# Patient Record
Sex: Female | Born: 1985 | Hispanic: Yes | Marital: Single | Smoking: Former smoker
Health system: Southern US, Community
[De-identification: ages and names within clinical notes are randomized; demographics above are authoritative.]

## PROBLEM LIST (undated history)

## (undated) DIAGNOSIS — F32A Depression, unspecified: Secondary | ICD-10-CM

## (undated) DIAGNOSIS — F329 Major depressive disorder, single episode, unspecified: Secondary | ICD-10-CM

## (undated) HISTORY — DX: Depression, unspecified: F32.A

## (undated) HISTORY — DX: Major depressive disorder, single episode, unspecified: F32.9

---

## 2014-08-22 ENCOUNTER — Ambulatory Visit (INDEPENDENT_AMBULATORY_CARE_PROVIDER_SITE_OTHER): Payer: 59

## 2014-08-22 ENCOUNTER — Ambulatory Visit (INDEPENDENT_AMBULATORY_CARE_PROVIDER_SITE_OTHER): Payer: 59 | Admitting: Family Medicine

## 2014-08-22 VITALS — BP 110/70 | HR 129 | Temp 100.0°F | Resp 18 | Ht 64.5 in | Wt 212.0 lb

## 2014-08-22 DIAGNOSIS — J069 Acute upper respiratory infection, unspecified: Secondary | ICD-10-CM

## 2014-08-22 DIAGNOSIS — R05 Cough: Secondary | ICD-10-CM

## 2014-08-22 DIAGNOSIS — R059 Cough, unspecified: Secondary | ICD-10-CM

## 2014-08-22 DIAGNOSIS — R000 Tachycardia, unspecified: Secondary | ICD-10-CM

## 2014-08-22 LAB — POCT CBC
Granulocyte percent: 70.7 %G (ref 37–80)
HEMATOCRIT: 45.2 % (ref 37.7–47.9)
Hemoglobin: 14.8 g/dL (ref 12.2–16.2)
LYMPH, POC: 2.6 (ref 0.6–3.4)
MCH: 27.3 pg (ref 27–31.2)
MCHC: 32.7 g/dL (ref 31.8–35.4)
MCV: 83.4 fL (ref 80–97)
MID (CBC): 0.4 (ref 0–0.9)
MPV: 7.3 fL (ref 0–99.8)
PLATELET COUNT, POC: 372 10*3/uL (ref 142–424)
POC Granulocyte: 7.1 — AB (ref 2–6.9)
POC LYMPH %: 25.5 % (ref 10–50)
POC MID %: 3.8 %M (ref 0–12)
RBC: 5.42 M/uL (ref 4.04–5.48)
RDW, POC: 14.4 %
WBC: 10.1 10*3/uL (ref 4.6–10.2)

## 2014-08-22 MED ORDER — GUAIFENESIN ER 1200 MG PO TB12: 1.0000 | ORAL_TABLET | Freq: Two times a day (BID) | ORAL | Status: DC | PRN

## 2014-08-22 MED ORDER — IPRATROPIUM BROMIDE 0.03 % NA SOLN: 2.0000 | Freq: Two times a day (BID) | NASAL | Status: DC

## 2014-08-22 MED ORDER — HYDROCOD POLST-CHLORPHEN POLST 10-8 MG/5ML PO LQCR: 5.0000 mL | Freq: Two times a day (BID) | ORAL | Status: DC | PRN

## 2014-08-22 NOTE — Progress Notes (Signed)
Subjective:    Patient ID: Jennifer Clements, female    DOB: Mar 25, 1986, 28 y.o.   MRN: [id number]  Cough Associated symptoms include chills, ear pain, a fever, myalgias, postnasal drip and a sore throat. Pertinent negatives include no chest pain.  Fever  Associated symptoms include congestion, coughing, ear pain, nausea and a sore throat. Pertinent negatives include no abdominal pain, chest pain, diarrhea or vomiting.    This is a 28 year old female who presents with 6 days of URI symptoms - cough, sore throat, nasal congestion, slight ear pain.  4 days ago she started feeling tired and achy. She continued to go to work. Last night she began to feel feverish and had chills. She did not take her temperature. She noted that since last night she can tell her heart rate is more rapid than normal. She has been taking nyquil and dayquil with some relief.  She has no sick contacts. She denies facial pain, palpitations, vomiting or diarrhea. She has had some nausea.  Review of Systems  Constitutional: Positive for fever, chills and fatigue.  HENT: Positive for congestion, ear pain, postnasal drip and sore throat. Negative for ear discharge.   Eyes: Negative.   Respiratory: Positive for cough.   Cardiovascular: Negative for chest pain and palpitations (has felt rapid heart rate).  Gastrointestinal: Positive for nausea. Negative for vomiting, abdominal pain, diarrhea and constipation.  Musculoskeletal: Positive for myalgias.       Objective:   Physical Exam  Vitals reviewed. Constitutional: She is oriented to person, place, and time. She appears well-developed and well-nourished. No distress.  HENT:  Head: Normocephalic and atraumatic.  Right Ear: External ear normal.  Left Ear: External ear normal.  Bilateral tympanic membranes clear. Oropharynx slightly erythematous with 2+ tonsils. No exudates present  Eyes: Conjunctivae and EOM are normal. Right eye exhibits no discharge. Left eye exhibits no  discharge.  Neck: Neck supple.  Cardiovascular: Regular rhythm and intact distal pulses.   No murmur heard. tachycardic  Pulmonary/Chest: Breath sounds normal. No respiratory distress. She has no wheezes. She has no rales.  Lymphadenopathy:    She has no cervical adenopathy.  Neurological: She is alert and oriented to person, place, and time.  Skin: Skin is warm and dry. No rash noted.  Psychiatric: She has a normal mood and affect. Her behavior is normal. Thought content normal.    [facility] reading (PRIMARY) by  Dr. Alwyn Ren: normal chest xray with 2 small granulomas.      Assessment & Plan:  1. Cough 2. Tachycardia 3. Viral URI  Patient presents with URI symptoms however, due to worsening of symptoms and duration of 6 days as well as tachycardia today to 129, further workup was done. Initial read on her chest xray was normal. CBC was within normal limits. This is likely just a viral URI. She was prescribed tussionex, mucinex and atrovent nasal spray. She can take ibuprofren if needed for fever or discomfort. She will be kept out of work until 08/24/14. She will call if her symptoms worsen or fail to improve.  - DG Chest 2 View; Future - chlorpheniramine-HYDROcodone (TUSSIONEX PENNKINETIC ER) 10-8 MG/5ML LQCR; Take 5 mLs by mouth every 12 (twelve) hours as needed for cough (cough).  Dispense: 100 mL; Refill: 0 - POCT CBC - Guaifenesin (MUCINEX MAXIMUM STRENGTH) 1200 MG TB12; Take 1 tablet (1,200 mg total) by mouth every 12 (twelve) hours as needed.  Dispense: 14 tablet; Refill: 1 - ipratropium (ATROVENT) 0.03 % nasal  spray; Place 2 sprays into both nostrils 2 (two) times daily.  Dispense: 30 mL; Refill: 0

## 2014-08-22 NOTE — Progress Notes (Signed)
Discussed patient and reviewed x-ray and agree with treatment plan.

## 2014-08-22 NOTE — Progress Notes (Signed)
I have examined this patient along with Ms. Bush, PA-C and agree.  

## 2014-08-22 NOTE — Patient Instructions (Signed)
Drink plenty of water and get plenty of rest. Return to clinic if symptoms don't improve.  Upper Respiratory Infection, Adult An upper respiratory infection (URI) is also known as the common cold. It is often caused by a type of germ (virus). Colds are easily spread (contagious). You can pass it to others by kissing, coughing, sneezing, or drinking out of the same glass. Usually, you get better in 1 or 2 weeks.  HOME CARE   Only take medicine as told by your doctor.  Use a warm mist humidifier or breathe in steam from a hot shower.  Drink enough water and fluids to keep your pee (urine) clear or pale yellow.  Get plenty of rest.  Return to work when your temperature is back to normal or as told by your doctor. You may use a face mask and wash your hands to stop your cold from spreading. GET HELP RIGHT AWAY IF:   After the first few days, you feel you are getting worse.  You have questions about your medicine.  You have chills, shortness of breath, or brown or red spit (mucus).  You have yellow or brown snot (nasal discharge) or pain in the face, especially when you bend forward.  You have a fever, puffy (swollen) neck, pain when you swallow, or white spots in the back of your throat.  You have a bad headache, ear pain, sinus pain, or chest pain.  You have a high-pitched whistling sound when you breathe in and out (wheezing).  You have a lasting cough or cough up blood.  You have sore muscles or a stiff neck. MAKE SURE YOU:   Understand these instructions.  Will watch your condition.  Will get help right away if you are not doing well or get worse. Document Released: 04/20/2008 Document Revised: 01/25/2012 Document Reviewed: 02/07/2014 [facility] Patient Information 2015 [employer] [place]. This information is not intended to replace advice given to you by your health care provider. Make sure you discuss any questions you have with your health care provider.

## 2014-08-31 ENCOUNTER — Other Ambulatory Visit: Payer: Self-pay | Admitting: Physician Assistant

## 2014-08-31 ENCOUNTER — Other Ambulatory Visit (HOSPITAL_COMMUNITY)
Admission: RE | Admit: 2014-08-31 | Discharge: 2014-08-31 | Disposition: A | Discharge status: Home / Self Care | Payer: 59 | Source: Ambulatory Visit | Attending: Physician Assistant | Admitting: Physician Assistant

## 2014-08-31 DIAGNOSIS — Z124 Encounter for screening for malignant neoplasm of cervix: Secondary | ICD-10-CM | POA: Insufficient documentation

## 2014-08-31 DIAGNOSIS — Z1151 Encounter for screening for human papillomavirus (HPV): Secondary | ICD-10-CM | POA: Insufficient documentation

## 2014-09-03 LAB — CYTOLOGY - PAP

## 2015-03-27 IMAGING — CR DG CHEST 2V
2 series · 2 of 2 positions shown · non-contrast
Comparison: None.

CLINICAL DATA: Initial encounter for Cough and congestion times 40
used.

EXAM:
CHEST  2 VIEW

[PA]
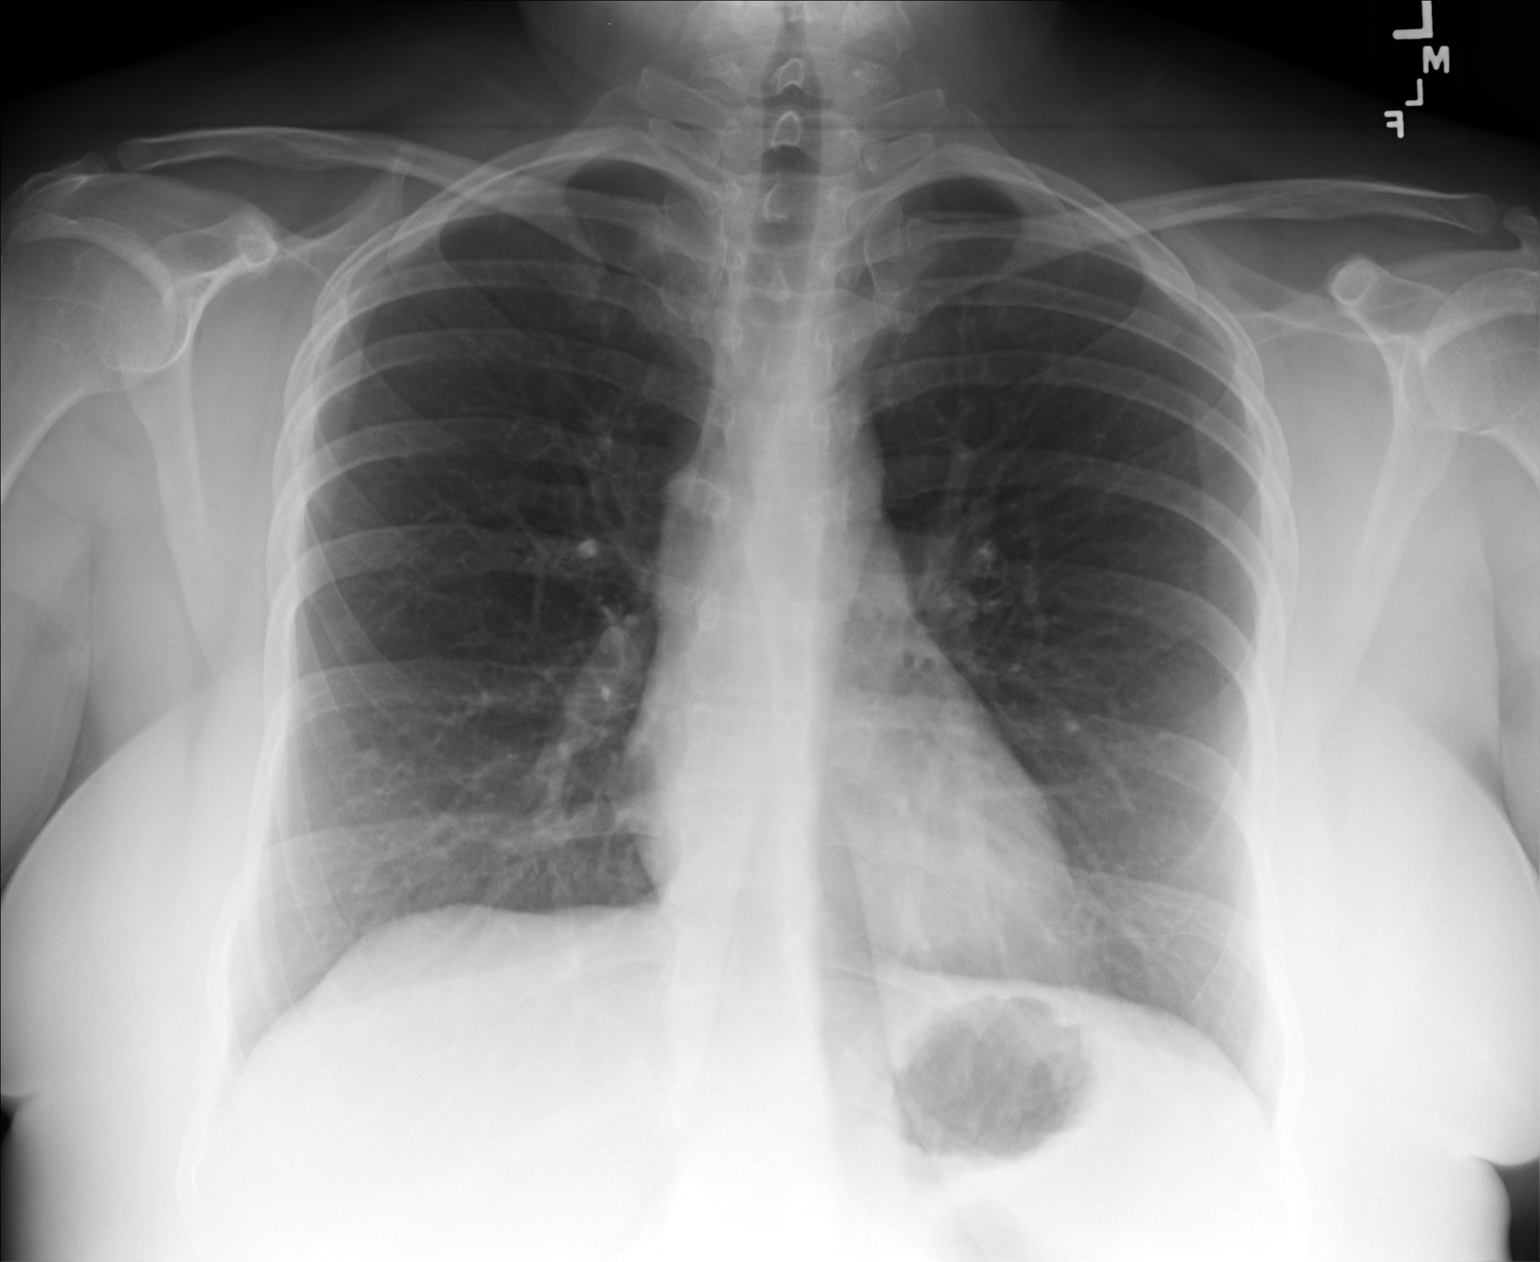

[lateral]
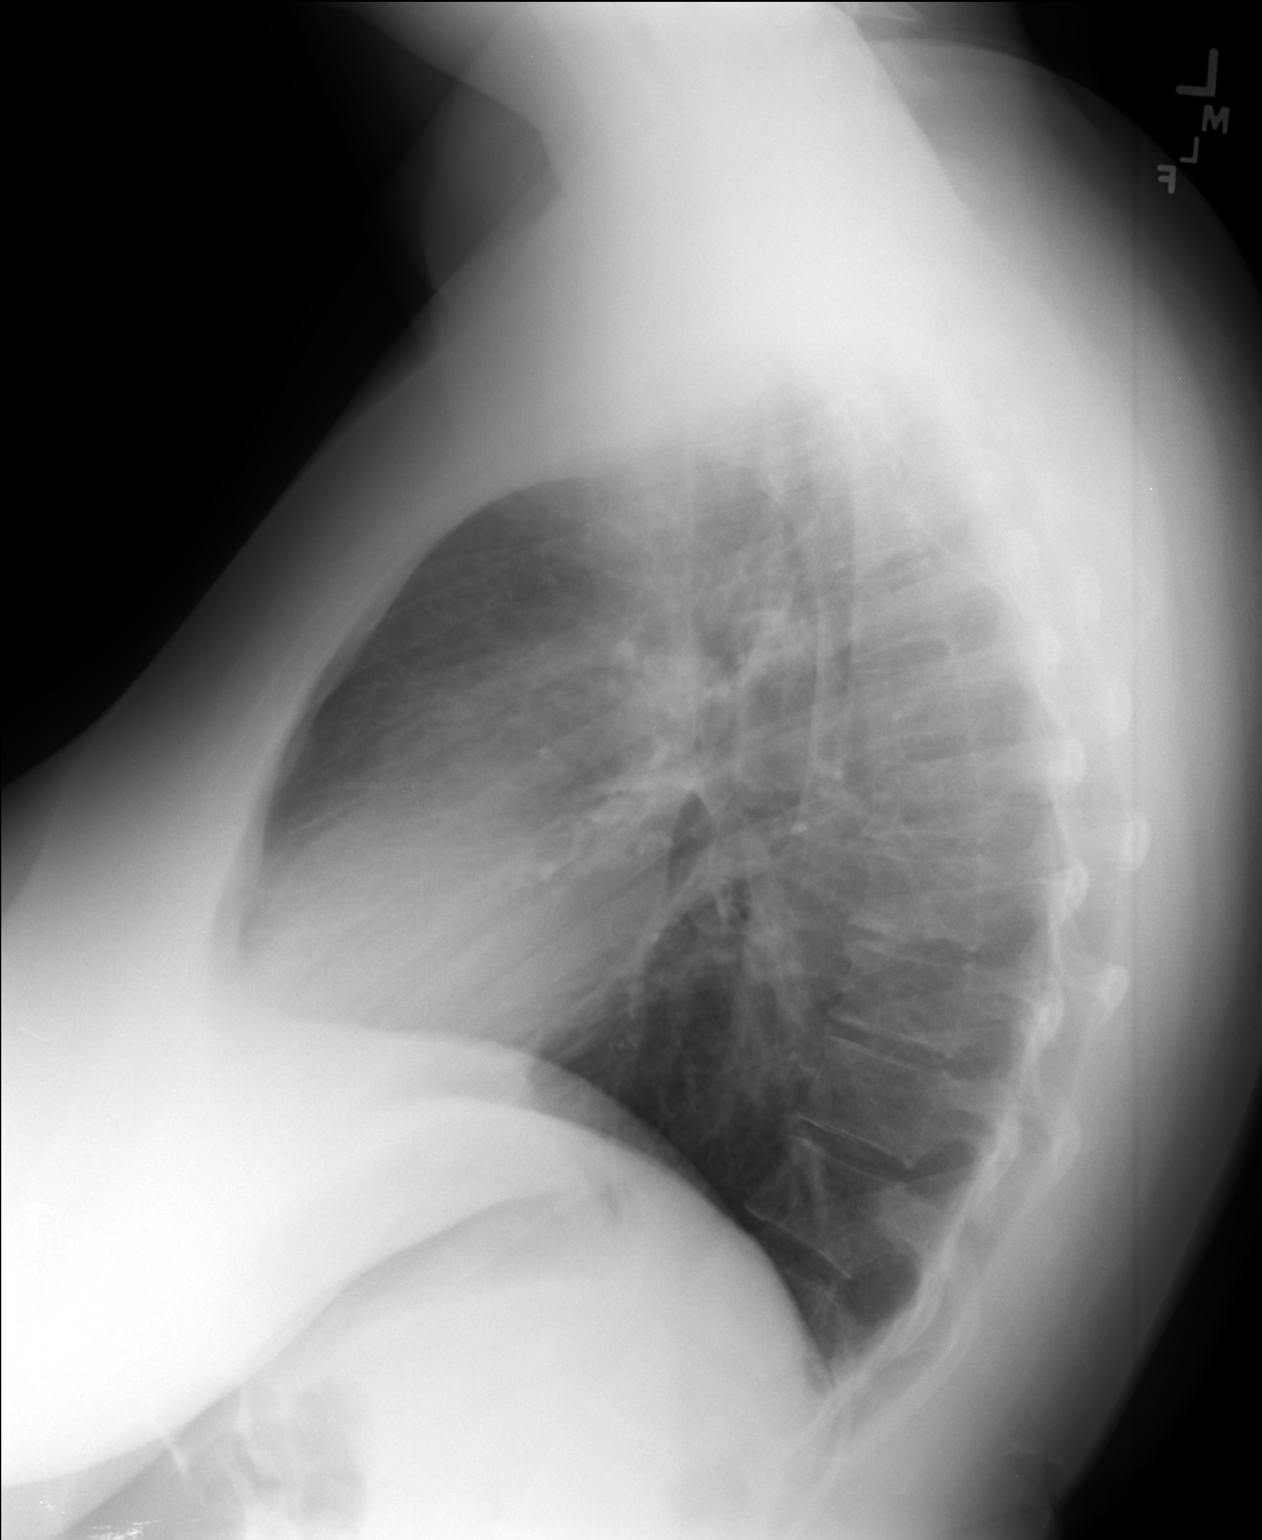

[2 of 2 positions shown; findings below may reference images not displayed]

FINDINGS: 4 mm nodular density is seen in the right lung. Left lung is clear.
No edema or focal airspace consolidation. No pleural effusion or
pneumothorax. The cardiopericardial silhouette is within normal
limits for size. Imaged bony structures of the thorax are intact.
IMPRESSION: Small nodular density projects over the anterior right sixth rib.
This may well be a granuloma and while calcification of this nodule
cannot be confirmed by x-ray, primary bronchogenic neoplasm would be
unlikely in a patient of this age. Otherwise no acute
cardiopulmonary findings.

## 2019-05-29 ENCOUNTER — Encounter (INDEPENDENT_AMBULATORY_CARE_PROVIDER_SITE_OTHER): Payer: Self-pay

## 2019-05-29 ENCOUNTER — Ambulatory Visit (INDEPENDENT_AMBULATORY_CARE_PROVIDER_SITE_OTHER): Payer: BC Managed Care – PPO | Admitting: Psychiatry

## 2019-05-29 ENCOUNTER — Other Ambulatory Visit: Payer: Self-pay

## 2019-05-29 ENCOUNTER — Encounter: Payer: Self-pay | Admitting: Psychiatry

## 2019-05-29 DIAGNOSIS — F331 Major depressive disorder, recurrent, moderate: Secondary | ICD-10-CM

## 2019-05-29 DIAGNOSIS — F411 Generalized anxiety disorder: Secondary | ICD-10-CM

## 2019-05-29 NOTE — Progress Notes (Signed)
[facility] Counselor Initial Adult Exam  Name: Jennifer Clements Date: 05/29/2019 MRN: [id number] DOB: 06/07/1986 PCP: Patient, No Pcp Per  Time spent: 51 minutes   Guardian/Payee:  patient    Paperwork requested:  Yes   Reason for Visit /Presenting Problem: Patient reported that she has been out of therapy for 6 years.  She shared that she was treated in the  past for anxiety and depression.  Patient reported that the depression was the primary issue.  She also shared that she has been diagnosed with ADHD. Patient was on meds for a while.  She reported that she was on Prozac for a while and it has helped but is having issues continuing  it due to changes in PCP.  Patient reported that she is having crying spells and feels she is having less control of herself recently. Saw someone in her 65's due to mother wanting her to go since her sister was going through a lot at a time.  Patient was diagnosed at 44 and 21 for ADHD.   Patient was born in [place] moved to [place], [place], back to [place] and back to [place].  Dad was in the [employer].  Close to parents they divorced 10 years ago. They both live in states currently.  Dad remarried when patient was an adult. Most of my maternal family is still in [place] feel like I am in mourning for the whole country due to political situation. Worried about her grandparents with the situation and poor health.  Work stress, issues with her supervisor and overwhelming amount of work. Patient was at [facility] in her 44's and was on Adderall.  She is currently wondering if she needs to be back on it due to difficulty completing tasks at work.  Patient was encouraged to consider what goals she wanted to have for treatment to be discussed at next session.  She was also encouraged to see 1 of the providers at the practice for medication assessment. Mental Status Exam:   Appearance:   Well Groomed     Behavior:  Appropriate  Motor:  Restlestness  Speech/Language:    Normal Rate  Affect:  Appropriate  Mood:  anxious and sad  Thought process:  normal  Thought content:    WNL  Sensory/Perceptual disturbances:    WNL  Orientation:  oriented to person, place, time/date and situation  Attention:  Fair  Concentration:  Fair  Memory:  Immediate;   Zinc of knowledge:   Good  Insight:    Good  Judgment:   Good  Impulse Control:  Good   Reported Symptoms:  Crying spells, feeling like things are harder, difficulty completing tasks and staying focused,  Anxiety, heart rate gets high, anxiety attacks, sleep issues, difficulty stopping thoughts and relaxing to go to sleep, unsettling dreams, muscle tension, headaches, neck pain, clenching hands more, isolating, difficulty sticking to a list  Risk Assessment: Danger to Self:  Yes.  without intent/plan patient reported that she just wants pain to stop doesn't want to hurt herself. Self-injurious Behavior: No Danger to Others: No Duty to Warn:no Physical Aggression / Violence:No  Access to Firearms a concern: No  Gang Involvement:No  Patient / guardian was educated about steps to take if suicide or homicide risk level increases between visits: yes While future psychiatric events cannot be accurately predicted, the patient does not currently require acute inpatient psychiatric care and does not currently meet [facility] involuntary commitment criteria.  Substance Abuse History: Current substance abuse:  No     Past Psychiatric History:   Previous psychological history is significant for anxiety and depression Outpatient Providers:none current History of Psych Hospitalization: No  Psychological Testing: none   Abuse History: Victim of No., none   Report needed: No. Victim of Neglect:No. Perpetrator of none  Witness / Exposure to Domestic Violence: No   Protective Services Involvement: No  Witness to [employer] Violence:  No   Family History:  Family History  Problem Relation Age of Onset  .  Hashimoto's thyroiditis Mother   . Depression Mother   . Mental illness Sister   . Hypertension Maternal Grandmother   . Heart disease Maternal Grandfather   . 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 History of legal issue / charges: none  Medical History/Surgical History:reviewed Past Medical History:  Diagnosis Date  . Depression     History reviewed. No pertinent surgical history.  Medications: Current Outpatient Medications  Medication Sig Dispense Refill  . chlorpheniramine-HYDROcodone (TUSSIONEX PENNKINETIC ER) 10-8 MG/5ML LQCR Take 5 mLs by mouth every 12 (twelve) hours as needed for cough (cough). 100 mL 0  . Guaifenesin (MUCINEX MAXIMUM STRENGTH) 1200 MG TB12 Take 1 tablet (1,200 mg total) by mouth every 12 (twelve) hours as needed. 14 tablet 1  . ipratropium (ATROVENT) 0.03 % nasal spray Place 2 sprays into both nostrils 2 (two) times daily. 30 mL 0   No current facility-administered medications for this visit.    Birth control  No Known Allergies  Diagnoses:    ICD-10-CM   1.  Major depressive disorder, recurrent episode, moderate (HCC)  F33.1   2. Generalized anxiety disorder  F41.1     Plan of Care: 1.  Patient to continue to engage in individual counseling 2-4 times a month or as needed. 2.  Patient to identify goals for treatment to discuss at next session to decrease depression and anxiety symptoms. 3.  Patient to contact this office, go to the local ED or call 911 if a crisis or emergency develops between visits.   Stevphen MeuseHolly Pinkie Manger, [facility]   This record has been created using [employer] software.  Chart creation errors have been sought, but may not always have been located and corrected. Such creation errors do not reflect on the standard of medical care.

## 2019-06-26 ENCOUNTER — Ambulatory Visit (INDEPENDENT_AMBULATORY_CARE_PROVIDER_SITE_OTHER): Payer: BC Managed Care – PPO | Admitting: Psychiatry

## 2019-06-26 ENCOUNTER — Other Ambulatory Visit: Payer: Self-pay

## 2019-06-26 DIAGNOSIS — F331 Major depressive disorder, recurrent, moderate: Secondary | ICD-10-CM

## 2019-06-26 DIAGNOSIS — F411 Generalized anxiety disorder: Secondary | ICD-10-CM

## 2019-06-26 NOTE — Progress Notes (Signed)
[facility] Counselor/Therapist Progress Note  Patient ID: Jennifer Clements, MRN: [id number],    Date: 06/26/2019  Time Spent: 50 minutes start time 5:03 PM end time 5:53 PM  Treatment Type: Individual Therapy  Reported Symptoms: sadness, anxiety, focusing issues, low motivation, fatigue  Mental Status Exam:  Appearance:   Well Groomed     Behavior:  Appropriate  Motor:  Normal  Speech/Language:   Normal Rate  Affect:  Appropriate  Mood:  anxious  Thought process:  normal  Thought content:    WNL  Sensory/Perceptual disturbances:    WNL  Orientation:  oriented to person, place, time/date and situation  Attention:  Good  Concentration:  Good  Memory:  WNL  Fund of knowledge:   Good  Insight:    Good  Judgment:   Good  Impulse Control:  Good   Risk Assessment: Danger to Self:  No Self-injurious Behavior: No Danger to Others: No Duty to Warn:no Physical Aggression / Violence:No  Access to Firearms a concern: No  Gang Involvement:No   Subjective: Patient was present for session.  Patient reported that her anxiety was getting very overwhelming for her.  She explained that she is realizing that it is time to talk to a provider about medication even though that is very hard for her to acknowledge.  Patient shared different reasons she is been struggling with her anxiety including her roommate having difficulty social distancing.  Patient shared she is also very worried about her father because he has difficulty making positive decisions when it comes to making sure he is wearing his mask and social distancing.  Patient reported that her biggest issue is her supervisor.  She shared she has struggles with the fact that he seems to let things go that are very important and then clients have to be put off and things that need to be done for them are not completed.  Patient went on to explain that he also interrupts her throughout her day and she gets distracted and is not able to  complete any of her tasks then she gets more more overwhelmed and has difficulty focusing.  Patient was able to think through the fact that she can put her phone on silence when she needs to and have the front office take because of client's so that she can get focused and get what she needs to accomplished.  Also had patient do an E MDR container exercise to try and give her a visual tool to utilize when dealing with her supervisor.  Patient was able to develop a picture that she felt would help her to disconnect when he is making choices that are very frustrating for her.  Patient was encouraged to remind herself to focus on the things she can control fix and change and to let go of the other things.  Interventions: Cognitive Behavioral Therapy, Solution-Oriented/Positive Psychology and Eye Movement Desensitization and Reprocessing (EMDR)  Diagnosis:   ICD-10-CM   1. Major depressive disorder, recurrent episode, moderate (HCC)  F33.1   2. Generalized anxiety disorder  F41.1     Plan: 1.  Patient to continue to engage in individual counseling 2-4 times a month or as needed. 2.  Patient to identify and apply CBT, coping skills learned in session to decrease depression and anxiety symptoms. 3.  Patient to contact this office, go to the local ED or call 911 if a crisis or emergency develops between visits.  [name], [facility]  This record has  been created using [employer] software.  Chart creation errors have been sought, but may not always have been located and corrected. Such creation errors do not reflect on the standard of medical care.

## 2019-06-28 ENCOUNTER — Encounter: Payer: Self-pay | Admitting: Psychiatry

## 2019-07-03 ENCOUNTER — Encounter: Payer: Self-pay | Admitting: Psychiatry

## 2019-07-03 ENCOUNTER — Ambulatory Visit (INDEPENDENT_AMBULATORY_CARE_PROVIDER_SITE_OTHER): Payer: BC Managed Care – PPO | Admitting: Psychiatry

## 2019-07-03 ENCOUNTER — Other Ambulatory Visit: Payer: Self-pay

## 2019-07-03 VITALS — BP 131/84 | HR 91 | Ht 64.0 in | Wt 220.0 lb

## 2019-07-03 DIAGNOSIS — F331 Major depressive disorder, recurrent, moderate: Secondary | ICD-10-CM

## 2019-07-03 DIAGNOSIS — F411 Generalized anxiety disorder: Secondary | ICD-10-CM | POA: Diagnosis not present

## 2019-07-03 DIAGNOSIS — G4700 Insomnia, unspecified: Secondary | ICD-10-CM

## 2019-07-03 MED ORDER — LITHIUM CARBONATE 150 MG PO CAPS: ORAL_CAPSULE | ORAL | 1 refills | Status: DC

## 2019-07-03 MED ORDER — FLUOXETINE HCL 10 MG PO CAPS: 10.0000 mg | ORAL_CAPSULE | Freq: Every day | ORAL | 1 refills | Status: DC

## 2019-07-03 NOTE — Progress Notes (Signed)
[facility] MD/PA/NP Initial Note  07/03/2019 4:00 PM Jennifer Clements  MRN:  [id number]  Chief Complaint:  Chief Complaint    Depression; Anxiety; ADD; Insomnia      HPI: Patient is a 33 year old female being seen for initial evaluation for treatment of depression, anxiety, attention deficit, and insomnia.  She is referred by her therapist, [name], [place].  Patient reports that she noticed some depression starting around age 48. Reports that she has taken some meds in the past to include Prozac and Zoloft with some improvement in anxiety and depression.Reports that depression has not fully remitted, and there are periods where it has been better and worse. Recalls having some mild anxiety in childhood. Notices jaw tightness with anxiety and muscle tension with anxiety. Reports that she is more likely to over-react at times with anxiety. Reports that she has had some panic attacks in the past in the context of severe anxiety. Denies any recent panic attacks. Reports worry about family and work. Reports that she has some catastrophic thinking and occ rumination. Denies social anxiety. Denies any excessive checking behaviors.    Describes mood as "fragile" and easily moved to tears. Reports feeling "bleak" most of the time. Experiences some irritability. She reports that she has noticed more depression since the end of last year. Difficulty falling asleep and awakens several times during the night. Reports that she rarely feels rested upon awakening. Nightmares about once a week. Reports that she may be in bed for 7-8 hours however sleep is fragmented. Appetite has been normal. Reports energy and motivation have been lower and interfering with function.She reports that concentration has been worse and is easily distracted. Reports that at times she will lose her train of thought with typing. Reports that sometimes will start to stare off. Enjoys spending time with sister and roommate. Notices that she has  withdrawn some from others. Reports passive death wishes and vague suicidal thoughts at times without plan or intent. Denies h/o past suicide attempts.   Denies any h/o manic s/s. Denies paranoia. Denies AH or VH.   Reports that she has been dx'd with ADD in second grade and was prescribed Ritalin and took only briefly. Later dx'd again with Adderall IR and took it for about 1.5 years  Born [place]. Family moved frequently. Sister is 100 months younger. Lived in [place] from ages [age] yo. Reports that she and her mother have a good relationship. Ok relationship with father. Parents divorced when she was 82 yo and they lived with maternal grandparents for 5 years. Graduated HS and has some college. 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 Reports that she likes her job. Has been working on site and seeing clients remotely. Works Tuesday through Saturday, 8:30-5:30 pm. Has had same roommate for years and they have been friends. Not in a relationship. Mother and sister are primary supports. Sister lives in [place] and Mom lives in [place]. Enjoys knitting, reading, and would like to start sewing. Reports that sister has attempted suicide several times and pt was present with sister's first attempt. Denies any other traumatic events.    Past Psychiatric Medication Trials: Prozac- Effective for anxiety. Took low dose, possibly 10 mg. Does not recall side effects.  Zoloft- Effective and then no longer seemed to be as effective. Had discontinuation s/s. Stopped in 2015   Adderall IR- Effective. Does not recall side effects. Typically felt more calm. May have taken 10 mg 1/2-1 tab  BID Trazodone Xanax- helpful for insomnia.   Visit Diagnosis:    ICD-10-CM   1. Major depressive disorder, recurrent episode, moderate (HCC)  F33.1 FLUoxetine (PROZAC) 10 MG capsule    lithium carbonate 150 MG capsule  2. Generalized anxiety disorder  F41.1 FLUoxetine (PROZAC) 10  MG capsule  3. Insomnia, unspecified type  G47.00 lithium carbonate 150 MG capsule    Past Psychiatric History: Was previously seen in Dr. Rolly PancakeParish McKinney's office for medication management and therapy. Recently started psychotherapy with Stevphen MeuseHolly Ingram, LPC. Denies past psychiatric hospitalization.  Past Medical History:  Past Medical History:  Diagnosis Date  . Depression    No past surgical history on file.   Family History:  Family History  Problem Relation Age of Onset  . Hashimoto's thyroiditis Mother   . Depression Mother   . Mental illness Sister   . Mood Disorder Sister   . Anxiety disorder Sister   . Hypertension Maternal Grandmother   . Heart disease Maternal Grandfather   . Alcohol abuse Paternal Grandfather   . Mental illness Maternal Aunt   . Alcohol abuse Paternal Aunt   . Depression Paternal Aunt   . Personality disorder Cousin     Social History:  Social History   Socioeconomic History  . Marital status: Single    Spouse name: Not on file  . Number of children: Not on file  . Years of education: Not on file  . Highest education level: Not on file  Occupational History  . Not on file  Social Needs  . Financial resource strain: Not on file  . Food insecurity    Worry: Not on file    Inability: Not on file  . Transportation needs    Medical: Not on file    Non-medical: Not on file  Tobacco Use  . Smoking status: Former Games developermoker  . Smokeless tobacco: Never Used  Substance and Sexual Activity  . Alcohol use: Yes    Alcohol/week: 2.0 standard drinks    Types: 2 Glasses of wine per week  . Drug use: Never  . Sexual activity: Not on file  Lifestyle  . Physical activity    Days per week: Not on file    Minutes per session: Not on file  . Stress: Not on file  Relationships  . Social Musicianconnections    Talks on phone: Not on file    Gets together: Not on file    Attends religious service: Not on file    Active member of club or organization: Not on  file    Attends meetings of clubs or organizations: Not on file    Relationship status: Not on file  Other Topics Concern  . Not on file  Social History Narrative  . Not on file    Allergies: No Known Allergies  Metabolic Disorder Labs: No results found for: HGBA1C, MPG No results found for: PROLACTIN No results found for: CHOL, TRIG, HDL, CHOLHDL, VLDL, LDLCALC No results found for: TSH  Therapeutic Level Labs: No results found for: LITHIUM No results found for: VALPROATE No components found for:  CBMZ  Current Medications: Current Outpatient Medications  Medication Sig Dispense Refill  . FLUoxetine (PROZAC) 10 MG capsule Take 1 capsule (10 mg total) by mouth daily. 30 capsule 1  . lithium carbonate 150 MG capsule Take 1 capsule po QHS x 3-5 days, then increase to 2 capsules po QHS 60 capsule 1  . norethindrone-ethinyl estradiol (LOESTRIN) 1-20 MG-MCG tablet TK 1 T PO  QD     No current facility-administered medications for this visit.     Medication Side Effects: none  Orders placed this visit:  No orders of the defined types were placed in this encounter.   Psychiatric Specialty Exam:  Review of Systems  HENT: Positive for sinus pain.   Psychiatric/Behavioral:       Please refer to HPI  All other systems reviewed and are negative.   Blood pressure 131/84, pulse 91, height 5\' 4"  (1.626 m), weight 220 lb (99.8 kg).Body mass index is 37.76 kg/m.  General Appearance: Casual  Eye Contact:  Good  Speech:  Clear and Coherent and Normal Rate  Volume:  Normal  Mood:  Anxious and Depressed  Affect:  Depressed and Anxious  Thought Process:  Coherent  Orientation:  Full (Time, Place, and Person)  Thought Content: Logical   Suicidal Thoughts:  Yes.  without intent/plan  Homicidal Thoughts:  No  Memory:  WNL  Judgement:  Good  Insight:  Good  Psychomotor Activity:  Normal  Concentration:  Concentration: Good  Recall:  Good  Fund of Knowledge: Good  Language: Good   Assets:  Communication Skills Desire for Improvement Resilience Social Support  ADL's:  Intact  Cognition: WNL  Prognosis:  Good   Receiving Psychotherapy: Yes   Treatment Plan/Recommendations: Patient seen for 60 minutes and greater than 50% of visit spent counseling patient regarding anxiety and depressive signs and symptoms and possible treatment options.  Discussed considering resuming Prozac since patient reports that this was well-tolerated and seemed to be beneficial.  Discussed potential benefits, risks, and side effects of Prozac.  Patient reports that she would prefer to start lower dose.  We will therefore start Prozac 10 mg daily.  Discussed potential benefits, risks, and side effects of lithium for suicidality since patient reports that she has been having passive death wishes and vague suicidal thoughts without plan or intent and this is distressing to her.  Discussed that some studies have shown that lithium can help decrease suicidal thoughts and behaviors.  Discussed that lithium can cause some drowsiness and this may be helpful for her insomnia. Patient agrees to trial of lithium.  Will start lithium 150 mg at bedtime for 3 to 5 days, then increase to 300 mg p.o. nightly.  Discussed recommendation to treat anxiety signs and symptoms since patient is more likely to tolerate stimulant when anxiety is controlled.  Also discussed that concentration difficulties may be currently exacerbated by anxiety and depression.  Patient agrees with this plan.  Recommend continuing to see [facility], [facility] for [facility].  Patient to follow-up with this provider in 4 weeks or sooner if clinically indicated.    Corie ChiquitoJessica Audreyanna Butkiewicz, PMHNP

## 2019-07-10 ENCOUNTER — Ambulatory Visit (INDEPENDENT_AMBULATORY_CARE_PROVIDER_SITE_OTHER): Payer: BC Managed Care – PPO | Admitting: Psychiatry

## 2019-07-10 ENCOUNTER — Other Ambulatory Visit: Payer: Self-pay

## 2019-07-10 DIAGNOSIS — F331 Major depressive disorder, recurrent, moderate: Secondary | ICD-10-CM

## 2019-07-10 NOTE — Progress Notes (Signed)
[facility] Counselor/Therapist Progress Note  Patient ID: Jennifer Clements, MRN: [id number],    Date: 07/10/2019  Time Spent: 50 minutes start time 5:03 p.m. end time 5:53 PM  Treatment Type: Individual Therapy  Reported Symptoms: sadness, anxiety, sleep issues,  Mental Status Exam:  Appearance:   Well Groomed     Behavior:  Appropriate  Motor:  Normal  Speech/Language:   Normal Rate  Affect:  Appropriate  Mood:  normal  Thought process:  normal  Thought content:    Rumination  Sensory/Perceptual disturbances:    WNL  Orientation:  oriented to person, place, time/date and situation  Attention:  Good  Concentration:  Good  Memory:  WNL  Fund of knowledge:   Good  Insight:    Good  Judgment:   Good  Impulse Control:  Good   Risk Assessment: Danger to Self:  No Self-injurious Behavior: No Danger to Others: No Duty to Warn:no Physical Aggression / Violence:No  Access to Firearms a concern: No  Gang Involvement:No   Subjective: Patient was present for session.  Patient reported that she feels her mood is starting to improve.  She shared that she had tried some of the strategies from last session and found them to be somewhat helpful.  She shared that work is still very difficult but she is trying to maintain perspective and remind herself to focus on the things that she can control fix and change and to allow her boss to fill responsibility for his choices.  Patient shared that she had a birthday over the weekend and it was challenging for her because people wanted to make a big deal out of her birthday but she did not really know what to do with the situation.  She shared that things are getting better with her roommate not bringing people over that have not been social distancing which is helping her.  But she knows that it still difficult for her roommate.  Also her roommate's birthday is coming up and that is bringing up a lot of stress for patient because she feels  pressured to do something for her with others but does not feel comfortable being around other people yet.  Different ways to deal with that situation were discussed with patient and the importance of her finding a balance of doing things for her roommate but also feeling comfortable was addressed.  Patient was able to develop some plans that she felt would be helpful and that she could work on with her roommate.  Explained that a lot of these issues would not concerns until COVID.  And she is hopeful that they can get to the other side of them.  Discussed the importance of figuring out ways to release some of the anxiety from her body in a healthy manner.  Patient explained she is still finding it difficult to get herself motivated to do any exercise even though she knows that helps her tremendously.  Encourage patient to break things down on the picture of how much exercise she is going to do until feels comfortable.  Patient was finally able to agree to work on trying to walk for 10 to 15 minutes in the evenings.  Patient was shared that she wanted to start the plan immediately and figured out how to talk to her roommate about taking a walk before dinner rather than them fixing dinner immediately when she gets home.  Patient reported that she feels that she gets in that habit every  night that she will be able to exercise and just released some stress from her body in a healthy manner.  Interventions: Cognitive Behavioral Therapy and Solution-Oriented/Positive Psychology  Diagnosis:   ICD-10-CM   1. Major depressive disorder, recurrent episode, moderate (HCC)  F33.1     Plan: 1.  Patient to continue to engage in individual counseling 2-4 times a month or as needed. 2.  Patient is to work on using CBT skills to help her get in the habit of exercising on a daily basis even if it is just for a short period of time.  Patient is also to follow through with plans on session to handle different situations at  work and at home that are creating anxiety and impacting her mood negatively. 3.  Patient to contact this office, go to the local ED or call 911 if a crisis or emergency develops between visits. Long-term goal: Develop healthy cognitive patterns and beliefs about self in the world that lead to the alleviation and help prevent the relapse of depression symptoms Short-term goal: Implement a regular exercise regimen as a depression reduction technique Identify and replace depressive thinking that leads to depressive feelings and actions Stevphen MeuseHolly Kashonda Sarkisyan, [facility]

## 2019-07-13 ENCOUNTER — Encounter: Payer: Self-pay | Admitting: Psychiatry

## 2019-07-31 ENCOUNTER — Encounter: Payer: Self-pay | Admitting: Psychiatry

## 2019-07-31 ENCOUNTER — Ambulatory Visit (INDEPENDENT_AMBULATORY_CARE_PROVIDER_SITE_OTHER): Payer: BC Managed Care – PPO | Admitting: Psychiatry

## 2019-07-31 ENCOUNTER — Other Ambulatory Visit: Payer: Self-pay

## 2019-07-31 VITALS — BP 141/86 | HR 80

## 2019-07-31 DIAGNOSIS — F411 Generalized anxiety disorder: Secondary | ICD-10-CM | POA: Diagnosis not present

## 2019-07-31 DIAGNOSIS — F331 Major depressive disorder, recurrent, moderate: Secondary | ICD-10-CM

## 2019-07-31 DIAGNOSIS — F900 Attention-deficit hyperactivity disorder, predominantly inattentive type: Secondary | ICD-10-CM

## 2019-07-31 MED ORDER — FLUOXETINE HCL 20 MG PO CAPS: 20.0000 mg | ORAL_CAPSULE | Freq: Every day | ORAL | 1 refills | Status: DC

## 2019-07-31 MED ORDER — AMPHETAMINE-DEXTROAMPHETAMINE 10 MG PO TABS: ORAL_TABLET | ORAL | 0 refills | Status: DC

## 2019-07-31 MED ORDER — LITHIUM CARBONATE 300 MG PO CAPS: 300.0000 mg | ORAL_CAPSULE | Freq: Every day | ORAL | 1 refills | Status: DC

## 2019-07-31 NOTE — Progress Notes (Signed)
[facility] Counselor/Therapist Progress Note  Patient ID: Jennifer Clements, MRN: [id number],    Date: 07/31/2019  Time Spent: 52 minutes start time 5 PM end time 5:52 PM Virtual Visit via Telephone Note Connected with patient by a video enabled telemedicine/telehealth application , with their informed consent, and verified patient privacy and that I am speaking with the correct person using two identifiers. I discussed the limitations, risks, security and privacy concerns of performing psychotherapy and management service by telephone and the availability of in person appointments. I also discussed with the patient that there may be a patient responsible charge related to this service. The patient expressed understanding and agreed to proceed. I discussed the treatment planning with the patient. The patient was provided an opportunity to ask questions and all were answered. The patient agreed with the plan and demonstrated an understanding of the instructions. The patient was advised to call  our office if  symptoms worsen or feel they are in a crisis state and need immediate contact.   Therapist Location: office Patient Location: home     Treatment Type: Individual Therapy  Reported Symptoms: depression, anxiety  Mental Status Exam:  Appearance:   Well Groomed     Behavior:  Appropriate  Motor:  Normal  Speech/Language:   Normal Rate  Affect:  Appropriate  Mood:  anxious  Thought process:  normal  Thought content:    WNL  Sensory/Perceptual disturbances:    WNL  Orientation:  oriented to person, place, time/date and situation  Attention:  Good  Concentration:  Good  Memory:  WNL  Fund of knowledge:   Good  Insight:    Good  Judgment:   Good  Impulse Control:  Good   Risk Assessment: Danger to Self:  No Self-injurious Behavior: No Danger to Others: No Duty to Warn:no Physical Aggression / Violence:No  Access to Firearms a concern: No  Gang Involvement:No    Subjective: Met with patient via virtual session through WebEx.  Patient reported she was making positive progress.  She had no passive thoughts of not wanting to be here and feels like she has been functioning better overall.  Patient shared that the exercises from last session has been very helpful and she utilizes it often.  Patient went on to admit she had started a week well with exercising but only made it a few days.  She did acknowledge when she was able to do it and follow the plan from session things went much better.  Had patient think through what she could do differently to work on making sure she does get into her routine of exercising each day since she noticed a positive result when she does state that.  Also discussed CBT skills to help her talk herself through getting back started after she misses the day for what ever reason.  Patient explained the biggest issue she is having currently continues to be work related.  She is finding herself getting overwhelmed and shutting down which only creates more anxiety and leads to more depression.  Patient was encouraged to think through the parts of her job that are overwhelming her and to develop strategies to manage them in smaller pieces that feel doable.  Also discussed ways that she could get more resources to help her complete work appropriately.  Patient explained that she is getting a lot of new situations and that makes her very anxious because she feels responsible for the people that she is working with.  Encouraged her to think about her role and focusing on what she can do but to also acknowledged there are things that she is not been a known or be able to complete and that that is an okay thing.  Patient was able to develop a plan to utilize her CBT skills to talk her self through accomplishing what she can at work each day.  Interventions: Cognitive Behavioral Therapy and Solution-Oriented/Positive Psychology  Diagnosis:   ICD-10-CM    1. Major depressive disorder, recurrent episode, moderate ([place])  F33.1     Plan: Patient is to continue to utilize the container exercise that she found to be very helpful from last session.  She is also to utilize CBT skills and plan developed in session to help her accomplish her goals at work to decrease feelings of being overwhelmed which leads to her depression.  Patient is also to implement the exercise plan so that she can make sure she is taking care of herself each day.  Long-term goal: Develop healthy cognitive patterns and beliefs about self in the world that lead to alleviation and and help prevent the relapse of depression symptoms. Short-term goal: Implement a regular exercise regimen as a depression reduction technique Identify and replace depressive thinking that leads to depressive feelings and actions  [name], [facility]

## 2019-07-31 NOTE — Progress Notes (Signed)
Jennifer Clements [id number] 22-Apr-1986 33 y.o.  Subjective:   Patient ID:  Jennifer Clements is a 33 y.o. (DOB 22-Apr-1986) female.  Chief Complaint:  Chief Complaint  Patient presents with  . Depression  . Anxiety  . ADHD  . Insomnia    HPI Jennifer Etiennedriana Kasa presents to the office today for follow-up of depression, anxiety, and insomnia.  "I definitely notice a difference." She reports that she has not had any episodes of crying and feeling as if she is about to lose control.  She reports that suicidal thoughts are much less frequent and better able to manage those. Continues to have some depression "but it is not as unmanageable." Reports that yesterday she was able to tell her sister how she was feeling and get support. Has occ irritability and thinks that this has been less and easier to redirect. Continues to have anxiety and reports that this has been more manageable. Reports some slight decrease in worry and rumination. Had episode of increased muscle tension on her way home from work and this led to panic s/s. She reports that she called her mother and it dissipated. Reports that she used to have this in the past.   She reports difficulty falling asleep and then difficulty getting up in the morning. She reports that it can take several hours to fall sleep and it takes less time with reading before bed. Reports awakening several times during the night. Nightmares have decreased some. Recalling other dreams more. Appetite has been normal. Energy and motivation have been variable. Reports some days it is very low and other days is able to be productive. Concentration reamins poor at times. Able to start more things since anxiety has been more manageable but then is easily distracted.   Had some nausea about 2 weeks after taking medication and started taking it with food and this resolved.   Reports that her work has recently been stressful.  Past Psychiatric Medication Trials: Prozac- Effective for  anxiety. Took low dose, possibly 10 mg. Does not recall side effects.  Zoloft- Effective and then no longer seemed to be as effective. Had discontinuation s/s. Stopped in 2015   Adderall IR- Effective. Does not recall side effects. Typically felt more calm. May have taken 10 mg 1/2-1 tab BID Trazodone Xanax- helpful for insomnia.   Review of Systems:  Review of Systems  Musculoskeletal: Negative for gait problem.       Muscle tension  Neurological: Positive for headaches. Negative for tremors.  Psychiatric/Behavioral:       Please refer to HPI    Medications: I have reviewed the patient's current medications.  Current Outpatient Medications  Medication Sig Dispense Refill  . cholecalciferol (VITAMIN D3) 25 MCG (1000 UT) tablet Take 1,000 Units by mouth daily.    Marland Kitchen. FLUoxetine (PROZAC) 20 MG capsule Take 1 capsule (20 mg total) by mouth daily. 30 capsule 1  . ibuprofen (ADVIL) 200 MG tablet Take 200 mg by mouth every 6 (six) hours as needed.    . Multiple Vitamins-Minerals (MULTIVITAMIN WOMEN PO) Take by mouth.    . norethindrone-ethinyl estradiol (LOESTRIN) 1-20 MG-MCG tablet TK 1 T PO QD    . amphetamine-dextroamphetamine (ADDERALL) 10 MG tablet Take 1/2-1 tab po BID 60 tablet 0  . lithium carbonate 300 MG capsule Take 1 capsule (300 mg total) by mouth at bedtime. 30 capsule 1   No current facility-administered medications for this visit.     Medication Side Effects: Other: Possible HA. Some nausea  with taking Lithium without food.   Allergies: No Known Allergies  Past Medical History:  Diagnosis Date  . Depression     Family History  Problem Relation Age of Onset  . Hashimoto's thyroiditis Mother   . Depression Mother   . Mental illness Sister   . Mood Disorder Sister   . Anxiety disorder Sister   . Hypertension Maternal Grandmother   . Heart disease Maternal Grandfather   . Alcohol abuse Paternal Grandfather   . Mental illness Maternal Aunt   . Alcohol abuse  Paternal Aunt   . Depression Paternal Aunt   . Personality disorder Cousin     Social History   Socioeconomic History  . Marital status: Single    Spouse name: Not on file  . Number of children: Not on file  . Years of education: Not on file  . Highest education level: Not on file  Occupational History  . Not on file  Social Needs  . Financial resource strain: Not on file  . Food insecurity    Worry: Not on file    Inability: Not on file  . Transportation needs    Medical: Not on file    Non-medical: Not on file  Tobacco Use  . Smoking status: Former Games developer  . Smokeless tobacco: Never Used  Substance and Sexual Activity  . Alcohol use: Yes    Alcohol/week: 2.0 standard drinks    Types: 2 Glasses of wine per week  . Drug use: Never  . Sexual activity: Not on file  Lifestyle  . Physical activity    Days per week: Not on file    Minutes per session: Not on file  . Stress: Not on file  Relationships  . Social Musician on phone: Not on file    Gets together: Not on file    Attends religious service: Not on file    Active member of club or organization: Not on file    Attends meetings of clubs or organizations: Not on file    Relationship status: Not on file  . Intimate partner violence    Fear of current or ex partner: Not on file    Emotionally abused: Not on file    Physically abused: Not on file    Forced sexual activity: Not on file  Other Topics Concern  . Not on file  Social History Narrative  . Not on file    Past Medical History, Surgical history, Social history, and Family history were reviewed and updated as appropriate.   Please see review of systems for further details on the patient's review from today.   Objective:   Physical Exam:  BP (!) 141/86   Pulse 80   Physical Exam Constitutional:      General: She is not in acute distress.    Appearance: She is well-developed.  Musculoskeletal:        General: No deformity.   Neurological:     Mental Status: She is alert and oriented to person, place, and time.     Coordination: Coordination normal.  Psychiatric:        Attention and Perception: Attention and perception normal. She does not perceive auditory or visual hallucinations.        Mood and Affect: Mood is anxious. Affect is not labile, blunt, angry or inappropriate.        Speech: Speech normal.        Behavior: Behavior normal. Behavior is cooperative.  Thought Content: Thought content normal. Thought content is not paranoid or delusional. Thought content does not include homicidal or suicidal ideation. Thought content does not include homicidal or suicidal plan.        Cognition and Memory: Cognition and memory normal.        Judgment: Judgment normal.     Comments: Mood presents as less depressed Insight intact. No delusions.      Lab Review:  No results found for: NA, K, CL, CO2, GLUCOSE, BUN, CREATININE, CALCIUM, PROT, ALBUMIN, AST, ALT, ALKPHOS, BILITOT, GFRNONAA, GFRAA     Component Value Date/Time   WBC 10.1 08/22/2014 1405   RBC 5.42 08/22/2014 1405   HGB 14.8 08/22/2014 1405   HCT 45.2 08/22/2014 1405   MCV 83.4 08/22/2014 1405   MCH 27.3 08/22/2014 1405   MCHC 32.7 08/22/2014 1405    No results found for: POCLITH, LITHIUM   No results found for: PHENYTOIN, PHENOBARB, VALPROATE, CBMZ   .res Assessment: Plan:   Discussed treatment options at length with patient and recommended increase in Prozac to 20 mg daily since patient has noted a partial response in mood and anxiety signs and symptoms with 10 mg dose.  Patient agrees to increase in Prozac. Patient request treatment of attention deficit since she reports that she continues to have considerable difficulty with concentration despite some partial improvement in anxiety.  Will restart Adderall 10 mg 1/2-1 tab p.o. twice daily since patient reports that this was effective and well-tolerated in the past.  Patient reports  that she plans to increase Prozac and then start Adderall about 1 week later to determine tolerability of higher dose of Prozac. We will continue lithium 300 mg at bedtime since this has been effective for her depression and chronic suicidal thoughts. Discussed option of treating insomnia, however patient prefers to hold off on starting any medication for sleep and would like to determine if insomnia is going to improve in response to other medication changes. Recommend continuing psychotherapy with [name], [place]. Patient to follow-up in 6 to 8 weeks or sooner if clinically indicated. Patient advised to contact office with any questions, adverse effects, or acute worsening in signs and symptoms.  Jennifer Clements was seen today for depression, anxiety, adhd and insomnia.  Diagnoses and all orders for this visit:  Attention deficit hyperactivity disorder (ADHD), predominantly inattentive type -     amphetamine-dextroamphetamine (ADDERALL) 10 MG tablet; Take 1/2-1 tab po BID  Major depressive disorder, recurrent episode, moderate (HCC) -     lithium carbonate 300 MG capsule; Take 1 capsule (300 mg total) by mouth at bedtime. -     FLUoxetine (PROZAC) 20 MG capsule; Take 1 capsule (20 mg total) by mouth daily.  Generalized anxiety disorder -     FLUoxetine (PROZAC) 20 MG capsule; Take 1 capsule (20 mg total) by mouth daily.     Please see After Visit Summary for patient specific instructions.  Future Appointments  Date Time Provider [place]  07/31/2019  5:00 PM [name], [facility] CP-CP None  09/11/2019  1:00 PM Thayer Headings, [facility] CP-CP None    No orders of the defined types were placed in this encounter.   -------------------------------

## 2019-08-03 ENCOUNTER — Encounter: Payer: Self-pay | Admitting: Psychiatry

## 2019-08-21 ENCOUNTER — Ambulatory Visit (INDEPENDENT_AMBULATORY_CARE_PROVIDER_SITE_OTHER): Payer: Self-pay | Admitting: Psychiatry

## 2019-08-21 ENCOUNTER — Encounter: Payer: Self-pay | Admitting: Psychiatry

## 2019-08-21 ENCOUNTER — Other Ambulatory Visit: Payer: Self-pay

## 2019-08-21 DIAGNOSIS — F331 Major depressive disorder, recurrent, moderate: Secondary | ICD-10-CM

## 2019-08-21 NOTE — Progress Notes (Signed)
      [facility] Counselor/Therapist Progress Note  Patient ID: Jennifer Clements, MRN: [id number],    Date: 08/21/2019  Time Spent: 60 minutes start time 11:05 a.m. and time 12:05 PM  Treatment Type: Individual Therapy  Reported Symptoms: anxiety, sadness, panic, fatigue, low motivation  Mental Status Exam:  Appearance:   Casual     Behavior:  Appropriate  Motor:  Normal  Speech/Language:   Normal Rate  Affect:  Appropriate  Mood:  sad  Thought process:  normal  Thought content:    WNL  Sensory/Perceptual disturbances:    WNL  Orientation:  oriented to person, place, time/date and situation  Attention:  Good  Concentration:  Good  Memory:  WNL  Fund of knowledge:   Fair  Insight:    Fair  Judgment:   Good  Impulse Control:  Good   Risk Assessment: Danger to Self:  No Self-injurious Behavior: No Danger to Others: No Duty to Warn:no Physical Aggression / Violence:No  Access to Firearms a concern: No  Gang Involvement:No   Subjective: Patient was present for session.  She reported that she felt her depression had increased and not sure why that happened. She stated she noticed it happening when she was talking to her mother.  She explained that she is in [place] and her support system has not been in the area so patient reported she worries about her.  Had patient explore more of what was triggered during conversation with mother.  She finally shared she feels a lot of guilt for not completing her degree.  Did E MDR set on that issue, suds level 8, negative cognition "I am a failure" felt guilt and shame in her chest and stomach.  Patient was able to reduce suds level to 5.  She was unable to realize she is not a failure but was able to share that she started feeling that way as a child when she could not focus and complete her work even though she knew she was mentally capable of doing so.  Also she finally admitted that her parents and teachers gave her a lot of negative  statements at that time.  Patient was encouraged to remind herself regularly she is enough and she is capable.  Discussed the importance of journaling what surfaces between sessions and getting back to exercising at least 15 minutes every day to release the emotions appropriately.  Interventions: Solution-Oriented/Positive Psychology and Eye Movement Desensitization and Reprocessing (EMDR)  Diagnosis:   ICD-10-CM   1. Major depressive disorder, recurrent episode, moderate ([place])  F33.1     Plan: Patient is to work on utilizing coping skills from previous sessions to help decrease her depression.  Patient is also to journal what surfaces between sessions that needs to still be resolved.  Patient is to work on exercising at least 15 minutes every day over the next week.  Long-term goal: Develop healthy cognitive patterns and beliefs about self in the world that lead to alleviation and help prevent the relapse of depression symptoms Short-term goal: Implement a regular exercise regimen as a depression reduction technique Identify and replace depressive thinking that leads to depressive feelings and actions  [name], [facility]

## 2019-09-11 ENCOUNTER — Ambulatory Visit (INDEPENDENT_AMBULATORY_CARE_PROVIDER_SITE_OTHER): Payer: BC Managed Care – PPO | Admitting: Psychiatry

## 2019-09-11 ENCOUNTER — Encounter: Payer: Self-pay | Admitting: Psychiatry

## 2019-09-11 ENCOUNTER — Other Ambulatory Visit: Payer: Self-pay

## 2019-09-11 VITALS — BP 128/83 | HR 83

## 2019-09-11 DIAGNOSIS — F331 Major depressive disorder, recurrent, moderate: Secondary | ICD-10-CM

## 2019-09-11 DIAGNOSIS — F411 Generalized anxiety disorder: Secondary | ICD-10-CM

## 2019-09-11 DIAGNOSIS — G4700 Insomnia, unspecified: Secondary | ICD-10-CM

## 2019-09-11 MED ORDER — LITHIUM CARBONATE 300 MG PO CAPS: 300.0000 mg | ORAL_CAPSULE | Freq: Every day | ORAL | 1 refills | Status: DC

## 2019-09-11 MED ORDER — FLUOXETINE HCL 20 MG PO CAPS: 20.0000 mg | ORAL_CAPSULE | Freq: Every day | ORAL | 1 refills | Status: DC

## 2019-09-11 MED ORDER — DOXEPIN HCL 6 MG PO TABS: 6.0000 mg | ORAL_TABLET | Freq: Every evening | ORAL | 0 refills | Status: DC | PRN

## 2019-09-11 MED ORDER — DOXEPIN HCL 3 MG PO TABS: 3.0000 mg | ORAL_TABLET | Freq: Every evening | ORAL | 0 refills | Status: DC | PRN

## 2019-09-11 NOTE — Progress Notes (Signed)
Jennifer Clements [id number] 07-19-1986 33 y.o.  Subjective:   Patient ID:  Jennifer Clements is a 33 y.o. (DOB 07-19-1986) female.  Chief Complaint:  Chief Complaint  Patient presents with  . Insomnia  . Anxiety  . Depression  . ADD    HPI Jennifer Etiennedriana Newlun presents to the office today for follow-up of ADD, Depression, and anxiety. She reports that she has recently been reluctant to take Adderall due to frequent HA's that she thinks are unrelated to medications. HA's typically start around mid-morning and can last throughout the day. Varying response to ibuprofen.  Questions if HA's may be r/t decreased sleep. Reports adequate hydration. Has noticed some neck tension, particularly when driving. Reports episodes of panic and SOB have been less frequent. She reports that she has anxiety in response to psychosocial stressors. Reports that she is better able to redirect catastrophic thoughts. Has had some depression but feels that it is "manageable." Reports that she is now able to use coping strategies. Appetite has been normal. Some difficulty falling asleep and is having frequent awakenings, sometimes up to 7 times a night. Does not recall dreams or restless legs. Energy and motivation are "variable." Reports that some days she is able to run errands and be productive and other times HA's seem to keep her from being around noise. Concentration has improved when she takes Adderall. She reports that she has had significant difficulty with concentration. Denies SI.   Some work stress.   Past Psychiatric Medication Trials: Prozac- Effective for anxiety. Took low dose, possibly 10 mg. Does not recall side effects.  Zoloft- Effective and then no longer seemed to be as effective. Had discontinuation s/s. Stopped in 2015  Adderall IR- Effective. Does not recall side effects. Typically felt more calm. May have taken 10 mg 1/2-1 tab BID Trazodone- Ineffective Xanax- helpful for insomnia.   Review of Systems:   Review of Systems  Gastrointestinal: Negative.   Musculoskeletal: Positive for neck pain. Negative for gait problem.  Skin:       Acne  Neurological: Positive for headaches. Negative for tremors.  Psychiatric/Behavioral:       Please refer to HPI    Medications: I have reviewed the patient's current medications.  Current Outpatient Medications  Medication Sig Dispense Refill  . amphetamine-dextroamphetamine (ADDERALL) 10 MG tablet Take 1/2-1 tab po BID 60 tablet 0  . cholecalciferol (VITAMIN D3) 25 MCG (1000 UT) tablet Take 1,000 Units by mouth daily.    Marland Kitchen. ibuprofen (ADVIL) 200 MG tablet Take 200 mg by mouth every 6 (six) hours as needed.    . Multiple Vitamins-Minerals (MULTIVITAMIN WOMEN PO) Take by mouth.    . norethindrone-ethinyl estradiol (LOESTRIN) 1-20 MG-MCG tablet TK 1 T PO QD    . Doxepin HCl (SILENOR) 3 MG TABS Take 1 tablet (3 mg total) by mouth at bedtime as needed. 8 tablet 0  . Doxepin HCl (SILENOR) 6 MG TABS Take 1 tablet (6 mg total) by mouth at bedtime as needed for up to 8 days. 8 tablet 0  . FLUoxetine (PROZAC) 20 MG capsule Take 1 capsule (20 mg total) by mouth daily. 30 capsule 1  . lithium carbonate 300 MG capsule Take 1 capsule (300 mg total) by mouth at bedtime. 30 capsule 1   No current facility-administered medications for this visit.     Medication Side Effects: None  Allergies: No Known Allergies  Past Medical History:  Diagnosis Date  . Depression     Family History  Problem  Relation Age of Onset  . Hashimoto's thyroiditis Mother   . Depression Mother   . Mental illness Sister   . Mood Disorder Sister   . Anxiety disorder Sister   . Hypertension Maternal Grandmother   . Heart disease Maternal Grandfather   . Alcohol abuse Paternal Grandfather   . Mental illness Maternal Aunt   . Alcohol abuse Paternal Aunt   . Depression Paternal Aunt   . Personality disorder Cousin     Social History   Socioeconomic History  . Marital status:  Single    Spouse name: Not on file  . Number of children: Not on file  . Years of education: Not on file  . Highest education level: Not on file  Occupational History  . Not on file  Social Needs  . Financial resource strain: Not on file  . Food insecurity    Worry: Not on file    Inability: Not on file  . Transportation needs    Medical: Not on file    Non-medical: Not on file  Tobacco Use  . Smoking status: Former Games developer  . Smokeless tobacco: Never Used  Substance and Sexual Activity  . Alcohol use: Yes    Alcohol/week: 2.0 standard drinks    Types: 2 Glasses of wine per week  . Drug use: Never  . Sexual activity: Not on file  Lifestyle  . Physical activity    Days per week: Not on file    Minutes per session: Not on file  . Stress: Not on file  Relationships  . Social Musician on phone: Not on file    Gets together: Not on file    Attends religious service: Not on file    Active member of club or organization: Not on file    Attends meetings of clubs or organizations: Not on file    Relationship status: Not on file  . Intimate partner violence    Fear of current or ex partner: Not on file    Emotionally abused: Not on file    Physically abused: Not on file    Forced sexual activity: Not on file  Other Topics Concern  . Not on file  Social History Narrative  . Not on file    Past Medical History, Surgical history, Social history, and Family history were reviewed and updated as appropriate.   Please see review of systems for further details on the patient's review from today.   Objective:   Physical Exam:  BP 128/83   Pulse 83   Physical Exam Constitutional:      General: She is not in acute distress.    Appearance: She is well-developed.  Musculoskeletal:        General: No deformity.  Neurological:     Mental Status: She is alert and oriented to person, place, and time.     Coordination: Coordination normal.  Psychiatric:         Attention and Perception: Attention and perception normal. She does not perceive auditory or visual hallucinations.        Mood and Affect: Mood is anxious. Mood is not depressed. Affect is not labile, blunt, angry or inappropriate.        Speech: Speech normal.        Behavior: Behavior normal.        Thought Content: Thought content normal. Thought content is not paranoid or delusional. Thought content does not include homicidal or suicidal ideation. Thought content does  not include homicidal or suicidal plan.        Cognition and Memory: Cognition and memory normal.        Judgment: Judgment normal.     Comments: Insight intact.      Lab Review:  No results found for: NA, K, CL, CO2, GLUCOSE, BUN, CREATININE, CALCIUM, PROT, ALBUMIN, AST, ALT, ALKPHOS, BILITOT, GFRNONAA, GFRAA     Component Value Date/Time   WBC 10.1 08/22/2014 1405   RBC 5.42 08/22/2014 1405   HGB 14.8 08/22/2014 1405   HCT 45.2 08/22/2014 1405   MCV 83.4 08/22/2014 1405   MCH 27.3 08/22/2014 1405   MCHC 32.7 08/22/2014 1405    No results found for: POCLITH, LITHIUM   No results found for: PHENYTOIN, PHENOBARB, VALPROATE, CBMZ   .res Assessment: Plan:   Discussed several possible treatment approaches to include increasing Prozac to 30 mg daily to further improve mood and anxiety, or starting medication for insomnia.  Patient reports that she would prefer to start medication for insomnia since she has been having persistent difficulty with sleep and thinks that might decrease sleep is likely exacerbating mood and anxiety.  Discussed potential benefits, risks, and side effects of several possible medications for insomnia to include Silenor and Belsomra.  Patient reports that she would like to start trial of Silenor and was provided with samples of Silenor.  Patient advised to start with Silenor 3 mg at bedtime and then increase to 6 mg at bedtime if 3 mg is not effective and there are no tolerability  issues. Discussed considering Belsomra if Silenor is not effective or poorly tolerated.  Advised patient to contact office if this occurs and patient could be provided samples of Belsomra. Will continue Prozac 20 mg daily for anxiety and depression. Continue lithium 300 mg at bedtime for depression and suicidal thoughts. Recommend continuing psychotherapy with [name], [place]. Patient to follow-up with this provider in 6 weeks or sooner if clinically indicated. Patient advised to contact office with any questions, adverse effects, or acute worsening in signs and symptoms.  Noma was seen today for insomnia, anxiety, depression and add.  Diagnoses and all orders for this visit:  Insomnia, unspecified type -     Doxepin HCl (SILENOR) 3 MG TABS; Take 1 tablet (3 mg total) by mouth at bedtime as needed. -     Doxepin HCl (SILENOR) 6 MG TABS; Take 1 tablet (6 mg total) by mouth at bedtime as needed for up to 8 days.  Major depressive disorder, recurrent episode, moderate (HCC) -     FLUoxetine (PROZAC) 20 MG capsule; Take 1 capsule (20 mg total) by mouth daily. -     lithium carbonate 300 MG capsule; Take 1 capsule (300 mg total) by mouth at bedtime.  Generalized anxiety disorder -     FLUoxetine (PROZAC) 20 MG capsule; Take 1 capsule (20 mg total) by mouth daily.     Please see After Visit Summary for patient specific instructions.  Future Appointments  Date Time Provider [place]  09/18/2019 11:00 AM [name], [facility] CP-CP None  10/23/2019 12:45 PM Thayer Headings, [facility]NP CP-CP None    No orders of the defined types were placed in this encounter.   -------------------------------

## 2019-09-18 ENCOUNTER — Other Ambulatory Visit: Payer: Self-pay

## 2019-09-18 ENCOUNTER — Encounter: Payer: Self-pay | Admitting: Psychiatry

## 2019-09-18 ENCOUNTER — Ambulatory Visit (INDEPENDENT_AMBULATORY_CARE_PROVIDER_SITE_OTHER): Payer: BC Managed Care – PPO | Admitting: Psychiatry

## 2019-09-18 DIAGNOSIS — F331 Major depressive disorder, recurrent, moderate: Secondary | ICD-10-CM

## 2019-09-18 NOTE — Progress Notes (Signed)
[facility] Counselor/Therapist Progress Note  Patient ID: Jennifer Clements, MRN: [id number],    Date: 09/18/2019  Time Spent: 52 minutes start time 11:07 AM end time 11:59 AM  Treatment Type: Individual Therapy  Reported Symptoms: depression, sleep issues, anxiety  Mental Status Exam:  Appearance:   Well Groomed     Behavior:  Appropriate  Motor:  Normal  Speech/Language:   Normal Rate  Affect:  Appropriate  Mood:  sad  Thought process:  normal  Thought content:    WNL  Sensory/Perceptual disturbances:    WNL  Orientation:  oriented to person, place, time/date and situation  Attention:  Good  Concentration:  Good  Memory:  WNL  Fund of knowledge:   Good  Insight:    Good  Judgment:   Good  Impulse Control:  Good   Risk Assessment: Danger to Self:  No Self-injurious Behavior: No Danger to Others: No Duty to Warn:no Physical Aggression / Violence:No  Access to Firearms a concern: No  Gang Involvement:No   Subjective: Patient was present for session.  She reported she feels stuck in a holding pattern with everything work, life. She shared that she and her mother had a fight over her planning on going to [place] for Christmas.  She went on to share that currently there are no flights out of the country only flights into the country.  Also, the virus is bad and the government is monitoring electronics and will do something to you if there are negative comments about the government. Patient went on to share work is still very stressful and she is making a effort to get things completed and she is but she is feeling very trapped.  Did EMDR set on that issue.  Suds level 10, negative cognition "I am trapped" felt tension in her stomach.  Patient was able to reduce suds level to 6.  She was able to recognize that she has a high need to know what is going to happen and how things are going to play out but that is not realistic with life.  She also was able to recognize that her  work does have a purpose and she is helpful.  Patient shared experience from the week that had brought her joy.  She was encouraged to start writing down notes about those experiences so she can read them when she is started having doubts and is upset.  Also the importance of continuing to recognize when her thoughts are spiraling and work on staying grounded by telling herself the truth was discussed with patient.  Interventions: Solution-Oriented/Positive Psychology and Eye Movement Desensitization and Reprocessing (EMDR)  Diagnosis:   ICD-10-CM   1. Major depressive disorder, recurrent episode, moderate ([place])  F33.1     Plan: Patient is to work on CBT and coping skills to help maintain perspective so that she can decrease negative thoughts when they start cycling.  Patient is also to start writing down positive things that occur especially at work so she can remember her value and worth is based more on the accomplishments and financial gain.  Patient is also to get back to exercising and working on healthy diet. Long-term goal: Develop healthy cognitive patterns and beliefs about self in the world that lead to alleviation and help prevent the relapse of depression symptoms Short-term goal: Implement a regular exercise regimen as a depression reduction technique Identify and replace depressive thinking that leads to depressive feelings and actions  [name], [facility]

## 2019-10-02 ENCOUNTER — Other Ambulatory Visit: Payer: Self-pay

## 2019-10-02 DIAGNOSIS — Z20822 Contact with and (suspected) exposure to covid-19: Secondary | ICD-10-CM

## 2019-10-03 DIAGNOSIS — Z01419 Encounter for gynecological examination (general) (routine) without abnormal findings: Secondary | ICD-10-CM | POA: Diagnosis not present

## 2019-10-04 LAB — NOVEL CORONAVIRUS, NAA: SARS-CoV-2, NAA: NOT DETECTED

## 2019-10-23 ENCOUNTER — Ambulatory Visit: Payer: Self-pay | Admitting: Psychiatry

## 2019-10-30 ENCOUNTER — Encounter: Payer: Self-pay | Admitting: Psychiatry

## 2019-10-30 ENCOUNTER — Ambulatory Visit (INDEPENDENT_AMBULATORY_CARE_PROVIDER_SITE_OTHER): Payer: BC Managed Care – PPO | Admitting: Psychiatry

## 2019-10-30 ENCOUNTER — Other Ambulatory Visit: Payer: Self-pay

## 2019-10-30 DIAGNOSIS — F331 Major depressive disorder, recurrent, moderate: Secondary | ICD-10-CM

## 2019-10-30 NOTE — Progress Notes (Signed)
[facility] Counselor/Therapist Progress Note  Patient ID: Jennifer Clements, MRN: [id number],    Date: 10/30/2019  Time Spent: 52 minutes start time 11:09 AM end time 12:01 PM  Treatment Type: Individual Therapy  Reported Symptoms: sadness, frustration, low motivation, anxiety, sleep issues, seasonal issues  Mental Status Exam:  Appearance:   Well Groomed     Behavior:  Sharing  Motor:  Normal  Speech/Language:   Normal Rate  Affect:  Appropriate  Mood:  anxious  Thought process:  normal  Thought content:    WNL  Sensory/Perceptual disturbances:    WNL  Orientation:  oriented to person, place, time/date and situation  Attention:  Good  Concentration:  Good  Memory:  WNL  Fund of knowledge:   Good  Insight:    Good  Judgment:   Good  Impulse Control:  Good   Risk Assessment: Danger to Self:  No Self-injurious Behavior: No Danger to Others: No Duty to Warn:no Physical Aggression / Violence:No  Access to Firearms a concern: No  Gang Involvement:No   Subjective: Patient was present for session.  She shared that there have been issues at work that have been difficult. She is also having issues with her roommate who is not wanting her to go anywhere without her which is making difficult for her to do simple things like going to her sisters house for dinner.  Patient stated that overall she felt she can handle that the biggest issue for her currently is her workload because she still finds herself hesitant to take her medication and she feels it is because she gets very overwhelmed and fearful of making a mistake.  Patient did EMDR set on her workload, suds level 7, negative cognition "I am responsible" felt tension in her stomach.  Patient was able to reduce as level to 2.  She was able to recognize the fact that she starts feeling she has to get things perfect and when she has an high amount of work and she is trying to do it very quickly that is not a practical belief.  Patient  was able to recognize that in the past when she is let things go too far out as when a mistake was made so currently she needs to go ahead and set some limits with her boss and let them know she is trying to be more proactive about getting things completed as they should without any mistakes.  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  Patient also shared that she went to see her father for Thanksgiving and it went really well and that had some conversations that helped her feel that things were moving in a positive direction in their relationship.  Patient was encouraged to continue working on that relationship as well as setting limits in her workplace.  Interventions: Solution-Oriented/Positive Psychology and Eye Movement Desensitization and Reprocessing (EMDR)  Diagnosis:   ICD-10-CM   1. Major depressive disorder, recurrent episode, moderate (HCC)  F33.1     Plan: Patient is to utilize CBT and coping skills to decrease depression symptoms.  Patient is to work on setting limits with her workload so that she does not get so overwhelmed and start shutting down.  Patient is also to continue working on exercising and self-care to improve mood also. Long-term goal:  Develop healthy cognitive patterns and beliefs about self in the world that lead to alleviation and help prevent the relapse of depression Short-term goal: Implement a regular exercise regimen as a depression reduction technique Identify and replace depressive thinking that leads to depressive feelings and actions [name], [facility]

## 2019-11-20 ENCOUNTER — Other Ambulatory Visit: Payer: Self-pay

## 2019-11-20 ENCOUNTER — Encounter: Payer: Self-pay | Admitting: Psychiatry

## 2019-11-20 ENCOUNTER — Ambulatory Visit (INDEPENDENT_AMBULATORY_CARE_PROVIDER_SITE_OTHER): Payer: BC Managed Care – PPO | Admitting: Psychiatry

## 2019-11-20 DIAGNOSIS — F411 Generalized anxiety disorder: Secondary | ICD-10-CM

## 2019-11-20 DIAGNOSIS — F900 Attention-deficit hyperactivity disorder, predominantly inattentive type: Secondary | ICD-10-CM | POA: Diagnosis not present

## 2019-11-20 DIAGNOSIS — F331 Major depressive disorder, recurrent, moderate: Secondary | ICD-10-CM

## 2019-11-20 MED ORDER — FLUOXETINE HCL 20 MG PO CAPS: 20.0000 mg | ORAL_CAPSULE | Freq: Every day | ORAL | 0 refills | Status: DC

## 2019-11-20 MED ORDER — AMPHETAMINE-DEXTROAMPHETAMINE 10 MG PO TABS: ORAL_TABLET | ORAL | 0 refills | Status: DC

## 2019-11-20 MED ORDER — DAYVIGO 5 MG PO TABS: 5.0000 mg | ORAL_TABLET | Freq: Every day | ORAL | 0 refills | Status: DC

## 2019-11-20 MED ORDER — LITHIUM CARBONATE 300 MG PO CAPS: 300.0000 mg | ORAL_CAPSULE | Freq: Every day | ORAL | 0 refills | Status: DC

## 2019-11-20 NOTE — Progress Notes (Signed)
Jennifer Clements [id number] August 29, 1986 34 y.o.  Subjective:   Patient ID:  Jennifer Clements is a 34 y.o. (DOB 09-Aug-1986) female.  Chief Complaint:  Chief Complaint  Patient presents with  . Insomnia  . Follow-up    ADD, Anxiety, Depression    HPI Jennifer Clements presents to the office today for follow-up of anxiety, depression, and ADD. She reports that anxiety has been slightly elevated and could be related to circumstances "but manageable." Notices increased muscle tightness with HA's and jaw tightness. Denies panic attacks. Has had increased anxiety when people have not texted her back when she is worried about them. Noticed this recently with her sister. Notices occasional catastrophic thinking. Mood has been "ok." Some slight decrease in energy and motivation.  Silenor helped slightly with staying asleep but not getting to sleep. Has been having difficulty falling and staying asleep. She reports that she received a weighted blanket for Christmas and this has been helpful. Appetite has been normal. Concentration improved with Adderall. Notices concentration is impaired if she does not take Adderall or when anxiety is increased. Denies SI.    Past Psychiatric Medication Trials: Prozac- Effective for anxiety. Took low dose, possibly 10 mg. Does not recall side effects.  Zoloft- Effective and then no longer seemed to be as effective. Had discontinuation s/s. Stopped in 2015  Adderall IR- Effective. Does not recall side effects. Typically felt more calm. May have taken 10 mg 1/2-1 tab BID Trazodone- Ineffective Silenor Xanax- helpful for insomnia.  Review of Systems:  Review of Systems  Musculoskeletal: Negative for gait problem.  Neurological: Negative for tremors.  Psychiatric/Behavioral:       Please refer to HPI    Medications: I have reviewed the patient's current medications.  Current Outpatient Medications  Medication Sig Dispense Refill  . amphetamine-dextroamphetamine (ADDERALL)  10 MG tablet Take 1/2-1 tab po BID 60 tablet 0  . cholecalciferol (VITAMIN D3) 25 MCG (1000 UT) tablet Take 1,000 Units by mouth daily.    Marland Kitchen ibuprofen (ADVIL) 200 MG tablet Take 200 mg by mouth every 6 (six) hours as needed.    . Multiple Vitamins-Minerals (MULTIVITAMIN WOMEN PO) Take by mouth.    . norethindrone-ethinyl estradiol (LOESTRIN) 1-20 MG-MCG tablet TK 1 T PO QD    . FLUoxetine (PROZAC) 20 MG capsule Take 1 capsule (20 mg total) by mouth daily. 90 capsule 0  . Lemborexant (DAYVIGO) 5 MG TABS Take 5 mg by mouth at bedtime. 20 tablet 0  . lithium carbonate 300 MG capsule Take 1 capsule (300 mg total) by mouth at bedtime. 90 capsule 0   No current facility-administered medications for this visit.    Medication Side Effects: Other: Rare palpitations if she takes Adderall without food and increased caffeine  Allergies: No Known Allergies  Past Medical History:  Diagnosis Date  . Depression     Family History  Problem Relation Age of Onset  . Hashimoto's thyroiditis Mother   . Depression Mother   . Mental illness Sister   . Mood Disorder Sister   . Anxiety disorder Sister   . Hypertension Maternal Grandmother   . Heart disease Maternal Grandfather   . Alcohol abuse Paternal Grandfather   . Mental illness Maternal Aunt   . Alcohol abuse Paternal Aunt   . Depression Paternal Aunt   . Personality disorder Cousin     Social History   Socioeconomic History  . Marital status: Single    Spouse name: Not on file  . Number of children:  Not on file  . Years of education: Not on file  . Highest education level: Not on file  Occupational History  . Not on file  Tobacco Use  . Smoking status: Former Games developer  . Smokeless tobacco: Never Used  Substance and Sexual Activity  . Alcohol use: Yes    Alcohol/week: 2.0 standard drinks    Types: 2 Glasses of wine per week  . Drug use: Never  . Sexual activity: Not on file  Other Topics Concern  . Not on file  Social History  Narrative  . Not on file   Social Determinants of Health   Financial Resource Strain:   . Difficulty of Paying Living Expenses: Not on file  Food Insecurity:   . Worried About Programme researcher, broadcasting/film/video in the Last Year: Not on file  . Ran Out of Food in the Last Year: Not on file  Transportation Needs:   . Lack of Transportation (Medical): Not on file  . Lack of Transportation (Non-Medical): Not on file  Physical Activity:   . Days of Exercise per Week: Not on file  . Minutes of Exercise per Session: Not on file  Stress:   . Feeling of Stress : Not on file  Social Connections:   . Frequency of Communication with Friends and Family: Not on file  . Frequency of Social Gatherings with Friends and Family: Not on file  . Attends Religious Services: Not on file  . Active Member of Clubs or Organizations: Not on file  . Attends Banker Meetings: Not on file  . Marital Status: Not on file  Intimate Partner Violence:   . Fear of Current or Ex-Partner: Not on file  . Emotionally Abused: Not on file  . Physically Abused: Not on file  . Sexually Abused: Not on file    Past Medical History, Surgical history, Social history, and Family history were reviewed and updated as appropriate.   Please see review of systems for further details on the patient's review from today.   Objective:   Physical Exam:  BP 121/76   Pulse 90   Physical Exam  Lab Review:  No results found for: NA, K, CL, CO2, GLUCOSE, BUN, CREATININE, CALCIUM, PROT, ALBUMIN, AST, ALT, ALKPHOS, BILITOT, GFRNONAA, GFRAA     Component Value Date/Time   WBC 10.1 08/22/2014 1405   RBC 5.42 08/22/2014 1405   HGB 14.8 08/22/2014 1405   HCT 45.2 08/22/2014 1405   MCV 83.4 08/22/2014 1405   MCH 27.3 08/22/2014 1405   MCHC 32.7 08/22/2014 1405    No results found for: POCLITH, LITHIUM   No results found for: PHENYTOIN, PHENOBARB, VALPROATE, CBMZ   .res Assessment: Plan:   Discussed potential benefits, risks,  and side effects of Dayvigo and pt agrees to trial of Dayvigo. Advised pt to call office and request prescription if Dayvigo is effective for insomnia.  Continue Prozac 20 mg po qd for anxiety and depression. Continue Lithium 300 mg po QHS for depression.  Recommend continuing psychotherapy with Stevphen Meuse, LPC.  Pt to f/u in 2 months or sooner if clinically indicated.  Patient advised to contact office with any questions, adverse effects, or acute worsening in signs and symptoms.  Hopelynn was seen today for insomnia and follow-up.  Diagnoses and all orders for this visit:  Attention deficit hyperactivity disorder (ADHD), predominantly inattentive type -     amphetamine-dextroamphetamine (ADDERALL) 10 MG tablet; Take 1/2-1 tab po BID  Major depressive disorder, recurrent episode,  moderate (HCC) -     lithium carbonate 300 MG capsule; Take 1 capsule (300 mg total) by mouth at bedtime. -     FLUoxetine (PROZAC) 20 MG capsule; Take 1 capsule (20 mg total) by mouth daily.  Generalized anxiety disorder -     FLUoxetine (PROZAC) 20 MG capsule; Take 1 capsule (20 mg total) by mouth daily.  Other orders -     Lemborexant (DAYVIGO) 5 MG TABS; Take 5 mg by mouth at bedtime.     Please see After Visit Summary for patient specific instructions.  Future Appointments  Date Time Provider [place]  12/04/2019  3:00 PM [name], [facility] CP-CP None  01/15/2020  1:00 PM Thayer Headings, PMHNP CP-CP None    No orders of the defined types were placed in this encounter.   -------------------------------

## 2019-11-25 ENCOUNTER — Other Ambulatory Visit: Payer: Self-pay | Admitting: Psychiatry

## 2019-11-25 DIAGNOSIS — F331 Major depressive disorder, recurrent, moderate: Secondary | ICD-10-CM

## 2019-11-25 DIAGNOSIS — F411 Generalized anxiety disorder: Secondary | ICD-10-CM

## 2019-11-26 NOTE — Telephone Encounter (Signed)
Submitted 11/20/2019

## 2019-12-04 ENCOUNTER — Other Ambulatory Visit: Payer: Self-pay

## 2019-12-04 ENCOUNTER — Ambulatory Visit (INDEPENDENT_AMBULATORY_CARE_PROVIDER_SITE_OTHER): Payer: BC Managed Care – PPO | Admitting: Psychiatry

## 2019-12-04 ENCOUNTER — Telehealth: Payer: Self-pay | Admitting: Psychiatry

## 2019-12-04 DIAGNOSIS — F331 Major depressive disorder, recurrent, moderate: Secondary | ICD-10-CM | POA: Diagnosis not present

## 2019-12-04 MED ORDER — DAYVIGO 5 MG PO TABS: 5.0000 mg | ORAL_TABLET | Freq: Every day | ORAL | 1 refills | Status: DC

## 2019-12-04 NOTE — Telephone Encounter (Signed)
Rx for Dayvigo 5 mg submitted to [facility] per request

## 2019-12-04 NOTE — Progress Notes (Signed)
      [facility] Counselor/Therapist Progress Note  Patient ID: Jennifer Clements, MRN: [id number],    Date: 12/04/2019  Time Spent: 50 minutes start time 3:08PM end time 3:58 PM  Treatment Type: Individual Therapy  Reported Symptoms: sadness isolating, irritability, triggered responses, anxiety  Mental Status Exam:  Appearance:   Well Groomed     Behavior:  Sharing  Motor:  Normal  Speech/Language:   Normal Rate  Affect:  Congruent  Mood:  sad  Thought process:  normal  Thought content:    WNL  Sensory/Perceptual disturbances:    WNL  Orientation:  oriented to person, place, time/date and situation  Attention:  Good  Concentration:  Good  Memory:  WNL  Fund of knowledge:   Good  Insight:    Good  Judgment:   Good  Impulse Control:  Good   Risk Assessment: Danger to Self:  No Self-injurious Behavior: No Danger to Others: No Duty to Warn:no Physical Aggression / Violence:No  Access to Firearms a concern: No  Gang Involvement:No   Subjective: Patient was present for session.  She shared she has had lower times recently.  She has found herself wanting to isolate more recently.   She went on to share that she has been triggered by recent events in the country. She is also realizing her family's country is about to go through an election year which is also anxiety producing anxiety.  She went on to explain that she is having some work issues.  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  Patient stated she feels she is had a pattern of not feeling like she has the right to say anything to set limits with others.  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  Patient was able to reduce suds level to 3.  She was  able to recognize that it is okay for her to stand up for herself and say things that she needs to say.  Patient was also able to recognize that she does make positive decisions and agreed to work on her affirmations and self-care.  Interventions: Solution-Oriented/Positive Psychology and Eye Movement Desensitization and Reprocessing (EMDR)  Diagnosis:   ICD-10-CM   1. Major depressive disorder, recurrent episode, moderate (HCC)  F33.1     Plan: Patient is to utilize CBT and coping skills to decrease depression symptoms.  Patient is to exercise and work on trying to have some socialization that she can also to decrease depressed mood. Long-term goal develop healthy cognitive patterns and beliefs about self in the world that lead to alleviation and help prevent the relapse of depression symptoms Short term goal: Implement a regular exercise regimen as a depression reduction technique Identify and replace depressive thinking that leads to depression feelings and actions Jennifer Clements, [facility]

## 2019-12-04 NOTE — Telephone Encounter (Signed)
Pt was checking out from [place] and requested Rx for DAYVIGO 5 mg @ [facility] on file. Next appt 3/1

## 2020-01-01 ENCOUNTER — Ambulatory Visit: Payer: BC Managed Care – PPO | Attending: Internal Medicine

## 2020-01-01 DIAGNOSIS — Z20822 Contact with and (suspected) exposure to covid-19: Secondary | ICD-10-CM

## 2020-01-02 LAB — NOVEL CORONAVIRUS, NAA: SARS-CoV-2, NAA: NOT DETECTED

## 2020-01-15 ENCOUNTER — Other Ambulatory Visit: Payer: Self-pay

## 2020-01-15 ENCOUNTER — Encounter: Payer: Self-pay | Admitting: Psychiatry

## 2020-01-15 ENCOUNTER — Ambulatory Visit (INDEPENDENT_AMBULATORY_CARE_PROVIDER_SITE_OTHER): Payer: BC Managed Care – PPO | Admitting: Psychiatry

## 2020-01-15 DIAGNOSIS — F330 Major depressive disorder, recurrent, mild: Secondary | ICD-10-CM

## 2020-01-15 DIAGNOSIS — F411 Generalized anxiety disorder: Secondary | ICD-10-CM | POA: Diagnosis not present

## 2020-01-15 DIAGNOSIS — F900 Attention-deficit hyperactivity disorder, predominantly inattentive type: Secondary | ICD-10-CM | POA: Diagnosis not present

## 2020-01-15 MED ORDER — AMPHETAMINE-DEXTROAMPHETAMINE 10 MG PO TABS: ORAL_TABLET | ORAL | 0 refills | Status: DC

## 2020-01-15 MED ORDER — LITHIUM CARBONATE 300 MG PO CAPS: ORAL_CAPSULE | ORAL | 0 refills | Status: DC

## 2020-01-15 MED ORDER — FLUOXETINE HCL 20 MG PO CAPS: ORAL_CAPSULE | ORAL | 0 refills | Status: DC

## 2020-01-15 NOTE — Progress Notes (Signed)
Jennifer Clements [id number] 12-02-1985 34 y.o.  Subjective:   Patient ID:  Jennifer Clements is a 34 y.o. (DOB November 18, 1985) female.  Chief Complaint:  Chief Complaint  Patient presents with  . Follow-up    h/o Depression, Anxiety, Insomnia, and ADD    HPI Jennifer Clements presents to the office today for follow-up of anxiety, depression, and insomnia. She reports that Dayvigo samples were helpful for her insomnia. She reports that since she ran out of samples and has not been able to get to the pharmacy and she has not slept as well. She reports that she was falling asleep easier and waking up less with Dayvigo. Has had increased nightmares and stress dreams in the last several weeks, with and without taking Dayvigo. Anxiety has been manageable. Mood has been "alright" and cannot recall any severe depressive episodes. Appetite has been normal. Energy has been ok. Motivation has been ok for the most part. Occasionally does not feel like doing certain things and is able to push through and do what needs to be done. She reports that concentration has been difficulty and that Adderall is helpful. She reports that she occasionally forgets to take Adderall right away and then will put it off and forget. She reports that Adderall has been helpful for starting tasks and not getting distracted. Denies SI.   Was recently able to see some of her cousins that she had not seen in awhile that she used to be very close with.     Review of Systems:  Review of Systems  Cardiovascular: Negative for palpitations.  Musculoskeletal: Negative for gait problem.  Neurological: Negative for tremors.  Psychiatric/Behavioral:       Please refer to HPI    Medications: I have reviewed the patient's current medications.  Current Outpatient Medications  Medication Sig Dispense Refill  . amphetamine-dextroamphetamine (ADDERALL) 10 MG tablet Take 1/2-1 tab po BID 60 tablet 0  . cholecalciferol (VITAMIN D3) 25 MCG (1000 UT) tablet  Take 1,000 Units by mouth daily.    Marland Kitchen FLUoxetine (PROZAC) 20 MG capsule TAKE 1 CAPSULE(20 MG) BY MOUTH DAILY 90 capsule 0  . ibuprofen (ADVIL) 200 MG tablet Take 200 mg by mouth every 6 (six) hours as needed.    . lithium carbonate 300 MG capsule TAKE 1 CAPSULE(300 MG) BY MOUTH AT BEDTIME 90 capsule 0  . Multiple Vitamins-Minerals (MULTIVITAMIN WOMEN PO) Take by mouth.    . norethindrone-ethinyl estradiol (LOESTRIN) 1-20 MG-MCG tablet TK 1 T PO QD    . [START ON 03/11/2020] amphetamine-dextroamphetamine (ADDERALL) 10 MG tablet Take 1/2-1 tab po BID 60 tablet 0  . [START ON 02/12/2020] amphetamine-dextroamphetamine (ADDERALL) 10 MG tablet Take 1/2-1 tab po BID 60 tablet 0  . Lemborexant (DAYVIGO) 5 MG TABS Take 5 mg by mouth at bedtime. 30 tablet 1   No current facility-administered medications for this visit.    Medication Side Effects: None  Allergies: No Known Allergies  Past Medical History:  Diagnosis Date  . Depression     Family History  Problem Relation Age of Onset  . Hashimoto's thyroiditis Mother   . Depression Mother   . Mental illness Sister   . Mood Disorder Sister   . Anxiety disorder Sister   . Hypertension Maternal Grandmother   . Heart disease Maternal Grandfather   . Alcohol abuse Paternal Grandfather   . Mental illness Maternal Aunt   . Alcohol abuse Paternal Aunt   . Depression Paternal Aunt   . Personality disorder Cousin  Social History   Socioeconomic History  . Marital status: Single    Spouse name: Not on file  . Number of children: Not on file  . Years of education: Not on file  . Highest education level: Not on file  Occupational History  . Not on file  Tobacco Use  . Smoking status: Former Games developer  . Smokeless tobacco: Never Used  Substance and Sexual Activity  . Alcohol use: Yes    Alcohol/week: 2.0 standard drinks    Types: 2 Glasses of wine per week  . Drug use: Never  . Sexual activity: Not on file  Other Topics Concern  . Not  on file  Social History Narrative  . Not on file   Social Determinants of Health   Financial Resource Strain:   . Difficulty of Paying Living Expenses: Not on file  Food Insecurity:   . Worried About Programme researcher, broadcasting/film/video in the Last Year: Not on file  . Ran Out of Food in the Last Year: Not on file  Transportation Needs:   . Lack of Transportation (Medical): Not on file  . Lack of Transportation (Non-Medical): Not on file  Physical Activity:   . Days of Exercise per Week: Not on file  . Minutes of Exercise per Session: Not on file  Stress:   . Feeling of Stress : Not on file  Social Connections:   . Frequency of Communication with Friends and Family: Not on file  . Frequency of Social Gatherings with Friends and Family: Not on file  . Attends Religious Services: Not on file  . Active Member of Clubs or Organizations: Not on file  . Attends Banker Meetings: Not on file  . Marital Status: Not on file  Intimate Partner Violence:   . Fear of Current or Ex-Partner: Not on file  . Emotionally Abused: Not on file  . Physically Abused: Not on file  . Sexually Abused: Not on file    Past Medical History, Surgical history, Social history, and Family history were reviewed and updated as appropriate.   Please see review of systems for further details on the patient's review from today.   Objective:   Physical Exam:  BP 124/81   Pulse 88   Physical Exam Constitutional:      General: She is not in acute distress. Musculoskeletal:        General: No deformity.  Neurological:     Mental Status: She is alert and oriented to person, place, and time.     Coordination: Coordination normal.  Psychiatric:        Attention and Perception: Attention and perception normal. She does not perceive auditory or visual hallucinations.        Mood and Affect: Mood normal. Mood is not anxious or depressed. Affect is not labile, blunt, angry or inappropriate.        Speech: Speech  normal.        Behavior: Behavior normal.        Thought Content: Thought content normal. Thought content is not paranoid or delusional. Thought content does not include homicidal or suicidal ideation. Thought content does not include homicidal or suicidal plan.        Cognition and Memory: Cognition and memory normal.        Judgment: Judgment normal.     Comments: Insight intact     Lab Review:  No results found for: NA, K, CL, CO2, GLUCOSE, BUN, CREATININE, CALCIUM, PROT, ALBUMIN, AST,  ALT, Marcy Panning, GFRNONAA, GFRAA     Component Value Date/Time   WBC 10.1 08/22/2014 1405   RBC 5.42 08/22/2014 1405   HGB 14.8 08/22/2014 1405   HCT 45.2 08/22/2014 1405   MCV 83.4 08/22/2014 1405   MCH 27.3 08/22/2014 1405   MCHC 32.7 08/22/2014 1405    No results found for: POCLITH, LITHIUM   No results found for: PHENYTOIN, PHENOBARB, VALPROATE, CBMZ   .res Assessment: Plan:   Will continue current plan of care since target signs and symptoms are well controlled without any tolerability issues. Recommend continuing therapy with Stevphen Meuse, [facility]. Pt to f/u with this provider in 3 months or sooner if clinically indicated.  Patient advised to contact office with any questions, adverse effects, or acute worsening in signs and symptoms.  Jennifer Clements was seen today for follow-up.  Diagnoses and all orders for this visit:  Attention deficit hyperactivity disorder (ADHD), predominantly inattentive type -     amphetamine-dextroamphetamine (ADDERALL) 10 MG tablet; Take 1/2-1 tab po BID -     amphetamine-dextroamphetamine (ADDERALL) 10 MG tablet; Take 1/2-1 tab po BID -     amphetamine-dextroamphetamine (ADDERALL) 10 MG tablet; Take 1/2-1 tab po BID  Mild episode of recurrent major depressive disorder (HCC) -     FLUoxetine (PROZAC) 20 MG capsule; TAKE 1 CAPSULE(20 MG) BY MOUTH DAILY -     lithium carbonate 300 MG capsule; TAKE 1 CAPSULE(300 MG) BY MOUTH AT BEDTIME  Generalized anxiety  disorder -     FLUoxetine (PROZAC) 20 MG capsule; TAKE 1 CAPSULE(20 MG) BY MOUTH DAILY     Please see After Visit Summary for patient specific instructions.  Future Appointments  Date Time Provider Department Center  01/22/2020 11:00 AM Stevphen Meuse, [facility] None  02/19/2020 11:00 AM Stevphen Meuse, [facility] None  04/22/2020  2:00 PM Corie Chiquito, [facility] CP-[facility] None    No orders of the defined types were placed in this encounter.   -------------------------------

## 2020-01-22 ENCOUNTER — Ambulatory Visit (INDEPENDENT_AMBULATORY_CARE_PROVIDER_SITE_OTHER): Payer: BC Managed Care – PPO | Admitting: Psychiatry

## 2020-01-22 ENCOUNTER — Other Ambulatory Visit: Payer: Self-pay

## 2020-01-22 DIAGNOSIS — F331 Major depressive disorder, recurrent, moderate: Secondary | ICD-10-CM | POA: Diagnosis not present

## 2020-01-22 NOTE — Progress Notes (Signed)
[facility] Counselor/Therapist Progress Note  Patient ID: Iolanda Folson, MRN: [id number],    Date: 01/22/2020  Time Spent: 52 minutes start time 11:04 AM 11:56 AM  Treatment Type: Individual Therapy  Reported Symptoms: fatigue, sleep issues, low motivation, sadness, stress, focusing issues  Mental Status Exam:  Appearance:   Well Groomed     Behavior:  Appropriate  Motor:  Normal  Speech/Language:   Normal Rate  Affect:  Flat  Mood:  sad  Thought process:  normal  Thought content:    WNL  Sensory/Perceptual disturbances:    WNL  Orientation:  oriented to person, place, time/date and situation  Attention:  Good  Concentration:  Good  Memory:  WNL  Fund of knowledge:   Good  Insight:    Good  Judgment:   Good  Impulse Control:  Good   Risk Assessment: Danger to Self:  No Self-injurious Behavior: No Danger to Others: No Duty to Warn:no Physical Aggression / Violence:No  Access to Firearms a concern: No  Gang Involvement:No   Subjective: Patient was present for session.  She reported she has not been feeling like herself and she is not sure exactly why.  She stated that she feels flat.  She stated she went to [place] with her family and she was feeling better but since she got back she is flat again.  Patient explained it is still had to do things but it is possible.  Patient was encouraged to explore what seems to be the trigger to her mood changes.  She was recognizing that a year ago is when everything started with COVID and her anxiety increased greatly in her functioning decreased.  She is wondering if that is triggering her.  Did EMDR set on one of the worst pictures during the [place] situation.  Patient chose her mom trying to get on a flight to [place], [place] level 9, and negative cognition "I am powerless" felt defensive and tension in her core.  Patient was able to reduce suds level to 4.  She was able to recognize the fact that her mother did not go and that was a  good thing.  Also her mother probably is not going to go until she gets the vaccination.  Patient reported she is waiting on her vaccination and trying to be patient.  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  Patient was encouraged to recognize that her black and white thinking sometimes gets her stuck and that the world is more gray.  Patient was encouraged to recognize the fact that she does meet criteria so it is okay for her to go ahead and get her vaccination and reduce her anxiety so she can get back to more normal level of functioning.  Also her mother is a former smoker and in her 105s.  She was encouraged to get her mother to get hers as well her father and sister have already gotten theirs so that is helping her already.  Patient agreed to follow through on plans from session and reported that she felt that it would help her.  Interventions: Solution-Oriented/Positive Psychology and Eye Movement Desensitization and Reprocessing (EMDR)  Diagnosis:   ICD-10-CM   1. Major depressive disorder, recurrent episode, moderate ([place])  F33.1     Plan: Patient is to use CBT and  coping skills to help decrease depression symptoms.  Patient is going to work on she and her mother get her vaccinations so she can decrease depression symptoms.  Patient is to remind herself regularly that gray thinking is okay and she does not have to think black and white. Long-term goal: Develop healthy cognitive patterns and beliefs about self in the world that lead to alleviation and help prevent the relapse of depression symptoms Short-term goal: Implement a regular exercise regimen as a depression reduction technique, identify and replace depressive thinking that leads to depressive feelings and action  Stevphen Meuse, [facility]

## 2020-02-19 ENCOUNTER — Ambulatory Visit (INDEPENDENT_AMBULATORY_CARE_PROVIDER_SITE_OTHER): Payer: BC Managed Care – PPO | Admitting: Psychiatry

## 2020-02-19 ENCOUNTER — Other Ambulatory Visit: Payer: Self-pay

## 2020-02-19 DIAGNOSIS — F330 Major depressive disorder, recurrent, mild: Secondary | ICD-10-CM | POA: Diagnosis not present

## 2020-02-19 NOTE — Progress Notes (Signed)
      [facility] Counselor/Therapist Progress Note  Patient ID: Jennifer Clements, MRN: [id number],    Date: 02/20/2020  Time Spent: 52 minutes start time 11:02 AM end time 11:54 AM  Treatment Type: Individual Therapy  Reported Symptoms: sadness, anxiety, sleep issues, focusing issues   Mental Status Exam:  Appearance:   Well Groomed     Behavior:  Sharing  Motor:  Normal  Speech/Language:   Normal Rate  Affect:  Appropriate  Mood:  normal  Thought process:  normal  Thought content:    WNL  Sensory/Perceptual disturbances:    WNL  Orientation:  oriented to person, place, time/date and situation  Attention:  Good  Concentration:  Good  Memory:  WNL  Fund of knowledge:   Good  Insight:    Good  Judgment:   Good  Impulse Control:  Good   Risk Assessment: Danger to Self:  No Self-injurious Behavior: No Danger to Others: No Duty to Warn:no Physical Aggression / Violence:No  Access to Firearms a concern: No  Gang Involvement:No   Subjective: Patient was present for session. She shared she was able to get her first vaccine and that has decreased her anxiety.  She stated she is excited to get most of her [place] family Vaccinated.  Patient shared that work has been more stressful recently.  She shared that someone at work has gotten more personal in his teasing recently.  She was able to confront the issue which was positive for her.  Patient went on to share she feels the biggest stressor for her currently is feeling torn between her mother and sister and some of the dynamics within their relationship.  Patient did EMDR on hearing mom talk about her sister, suds level 7, negative cognition "I have to defend her" felt anxiety in her body.  Patient was able to reduce suds level to 4.  She was able to share that she is always felt very torn with her mother and sister.  Patient expressed some of the dynamics that have created that for her.  She was encouraged to remind herself that she did not  cause any other problems and this whole time she was a child she was not responsible for fixing him even though she felt that she was.  Agreed to continue working on issue at next session.  Interventions: Solution-Oriented/Positive Psychology and Eye Movement Desensitization and Reprocessing (EMDR)  Diagnosis:   ICD-10-CM   1. Mild episode of recurrent major depressive disorder (HCC)  F33.0     Plan: Patient is to use CBT and coping skills to decrease depression symptoms.  Patient is to remind herself that she is enough.  She is to work on continuing to set limits with other people as needed. Long-term goal: Develop healthy cognitive patterns and beliefs about self in the world that lead to alleviation and help prevent the relapse of depression symptoms Short-term goal: Implement a regular exercise regimen as a depression reduction technique Identify and replace depressive thinking that leads to depressive feelings and actions  Stevphen Meuse, [facility]

## 2020-03-18 ENCOUNTER — Ambulatory Visit (INDEPENDENT_AMBULATORY_CARE_PROVIDER_SITE_OTHER): Payer: BC Managed Care – PPO | Admitting: Psychiatry

## 2020-03-18 ENCOUNTER — Other Ambulatory Visit: Payer: Self-pay

## 2020-03-18 DIAGNOSIS — F330 Major depressive disorder, recurrent, mild: Secondary | ICD-10-CM | POA: Diagnosis not present

## 2020-03-18 NOTE — Progress Notes (Signed)
[facility] Counselor/Therapist Progress Note  Patient ID: Jennifer Clements, MRN: [id number],    Date: 03/18/2020  Time Spent: 48 minutes start time 11:12 AM end time 12 PM  Treatment Type: Individual Therapy  Reported Symptoms: anxiety, sadness, triggered responses  Mental Status Exam:  Appearance:   Well Groomed     Behavior:  Appropriate  Motor:  Normal  Speech/Language:   Normal Rate  Affect:  Appropriate  Mood:  anxious  Thought process:  normal  Thought content:    WNL  Sensory/Perceptual disturbances:    WNL  Orientation:  oriented to person, place, time/date and situation  Attention:  Good  Concentration:  Good  Memory:  WNL  Fund of knowledge:   Good  Insight:    Good  Judgment:   Good  Impulse Control:  Good   Risk Assessment: Danger to Self:  No Self-injurious Behavior: No Danger to Others: No Duty to Warn:no Physical Aggression / Violence:No  Access to Firearms a concern: No  Gang Involvement:No   Subjective: Patient was present for session.  She shared that she had a conversation with her sister after session that brought up past issues.  Discussed how patient had a better relationship with mom that patient.  Sister stated she was wondering if she is on the spectrum of Autism and she is wondering if that has impacted things for her.  Patient went on to explain that she still feels torn between her mother and sister but is recognizing that she cannot fix their relationship.  Patient stated she is trying to keep perspective and reminding herself of that and just talking with mom and sister separately and hoping at some point they will work through their issues together.  Patient stated the other thing that brought up a lot of emotions for her was seeing pictures of the police being at her grandfather's time in [place].  She explained that someone who was wanting to run for public office was meeting with her grandfather and the police found down came up and rested  the man.  She shared they let him go after a day and it was more a show of intimidation but it was very scary for her to see the pictures.  She shared that is one of the fears that she has had for her grandfather that he would be arrested for no reason.  Was encouraged to try and remind herself that she is okay and stay grounded even when it is very difficult.  The importance of using her coping skills regularly rather than allowing her thoughts to ruminate was addressed with patient.  She shared she is doing better now that she is able to get outside and her whole family is vaccinated that has reduced layer of anxiety which is making things better.  Patient was encouraged to continue trying to focus on things that are helping her stay engaged in positive activity.  Interventions: Solution-Oriented/Positive Psychology  Diagnosis:   ICD-10-CM   1. Mild episode of recurrent major depressive disorder (HCC)  F33.0     Plan: Patient is to use CBT and coping skills to decrease depression symptoms.  Patient is to continue reminding herself that her issues are separate from her mother's and sisters and she has to allow them to work on their relationship.  Is she is to continue using her CBT filters to get through her work situations. Long-term goal: Develop healthy cognitive patterns and beliefs about self in the world that  lead to alleviation and help prevent the relapse of depression symptoms Short-term goal: Implement a regular exercise regimen as a depression reduction technique.  Identify and replace depressive thinking that leads to depressive feelings and actions  Jennifer Clements, [facility]

## 2020-03-22 ENCOUNTER — Ambulatory Visit: Payer: BC Managed Care – PPO | Admitting: Psychiatry

## 2020-04-22 ENCOUNTER — Ambulatory Visit (INDEPENDENT_AMBULATORY_CARE_PROVIDER_SITE_OTHER): Payer: BC Managed Care – PPO | Admitting: Psychiatry

## 2020-04-22 ENCOUNTER — Encounter: Payer: Self-pay | Admitting: Psychiatry

## 2020-04-22 ENCOUNTER — Other Ambulatory Visit: Payer: Self-pay

## 2020-04-22 DIAGNOSIS — F330 Major depressive disorder, recurrent, mild: Secondary | ICD-10-CM | POA: Diagnosis not present

## 2020-04-22 DIAGNOSIS — F411 Generalized anxiety disorder: Secondary | ICD-10-CM | POA: Diagnosis not present

## 2020-04-22 MED ORDER — LITHIUM CARBONATE 300 MG PO CAPS: ORAL_CAPSULE | ORAL | 0 refills | Status: DC

## 2020-04-22 MED ORDER — FLUOXETINE HCL 20 MG PO CAPS: ORAL_CAPSULE | ORAL | 0 refills | Status: DC

## 2020-04-22 NOTE — Progress Notes (Signed)
[name] [id number] 14-Sep-1986 34 y.o.  Subjective:   Patient ID:  Jennifer Clements is a 34 y.o. (DOB Jan 25, 1986) female.  Chief Complaint:  Chief Complaint  Patient presents with  . Follow-up    h/o Anxiety, Depression, Insomnia, and ADD    HPI Jennifer Clements presents to the office today for follow-up of depression, ADHD, Anxiety, and insomnia. She reports that she had a recent "slump" for several days and noticed she was not interested in things. She reports that depression is manageable. She reports that her energy and motivation fluctuate. She reports that she was taking Adderall sparingly and has not wanted to get dependent on it. Now recognizes that it is helpful for completing tasks at work. She is now taking Adderall 4-5 times a week. She reports that concentration is improved with Adderall and better able to start tasks. She notices less procrastination with Adderall. Anxiety has been manageable. She reports that Oliva Bustard seems to work best on an empty stomach and otherwise would take awhile to take effect and then feeling drowsy in the morning. She reports sleeping 7-9 hours with middle of the night awakenings. Denies any recent nightmares. Appetite has been normal. She reports having vague, fleeting SI during period of worsening mood s/s. Denies SI.   Past Psychiatric Medication Trials: Prozac- Effective for anxiety. Took low dose, possibly 10 mg. Does not recall side effects.  Zoloft- Effective and then no longer seemed to be as effective. Had discontinuation s/s. Stopped in 2015  Adderall IR- Effective. Does not recall side effects. Typically felt more calm. May have taken 10 mg 1/2-1 tab BID Trazodone- Ineffective Silenor Dayvigo- Excessive daytime somnolence Xanax- helpful for insomnia.    Review of Systems:  Review of Systems  Eyes:       Recent eye twitch  Musculoskeletal: Negative for gait problem.  Neurological: Positive for tremors.       Reports occ hand tremor   Psychiatric/Behavioral:       Please refer to HPI    Medications: I have reviewed the patient's current medications.  Current Outpatient Medications  Medication Sig Dispense Refill  . amphetamine-dextroamphetamine (ADDERALL) 10 MG tablet Take 1/2-1 tab po BID 60 tablet 0  . amphetamine-dextroamphetamine (ADDERALL) 10 MG tablet Take 1/2-1 tab po BID 60 tablet 0  . amphetamine-dextroamphetamine (ADDERALL) 10 MG tablet Take 1/2-1 tab po BID 60 tablet 0  . cholecalciferol (VITAMIN D3) 25 MCG (1000 UT) tablet Take 1,000 Units by mouth daily.    Marland Kitchen FLUoxetine (PROZAC) 20 MG capsule TAKE 1 CAPSULE(20 MG) BY MOUTH DAILY 90 capsule 0  . ibuprofen (ADVIL) 200 MG tablet Take 200 mg by mouth every 6 (six) hours as needed.    . lithium carbonate 300 MG capsule TAKE 1 CAPSULE(300 MG) BY MOUTH AT BEDTIME 90 capsule 0  . Multiple Vitamins-Minerals (MULTIVITAMIN WOMEN PO) Take by mouth.    . norethindrone-ethinyl estradiol (LOESTRIN) 1-20 MG-MCG tablet TK 1 T PO QD     No current facility-administered medications for this visit.    Medication Side Effects: Other: Excessive somnolence with Dayvigo  Allergies: No Known Allergies  Past Medical History:  Diagnosis Date  . Depression     Family History  Problem Relation Age of Onset  . Hashimoto's thyroiditis Mother   . Depression Mother   . Mental illness Sister   . Mood Disorder Sister   . Anxiety disorder Sister   . Hypertension Maternal Grandmother   . Heart disease Maternal Grandfather   . Alcohol  abuse Paternal Grandfather   . Mental illness Maternal Aunt   . Alcohol abuse Paternal Aunt   . Depression Paternal Aunt   . Personality disorder Cousin     Social History   Socioeconomic History  . Marital status: Single    Spouse name: Not on file  . Number of children: Not on file  . Years of education: Not on file  . Highest education level: Not on file  Occupational History  . Not on file  Tobacco Use  . Smoking status: Former  Games developer  . Smokeless tobacco: Never Used  Substance and Sexual Activity  . Alcohol use: Yes    Alcohol/week: 2.0 standard drinks    Types: 2 Glasses of wine per week  . Drug use: Never  . Sexual activity: Not on file  Other Topics Concern  . Not on file  Social History Narrative  . Not on file   Social Determinants of Health   Financial Resource Strain:   . Difficulty of Paying Living Expenses:   Food Insecurity:   . Worried About Programme researcher, broadcasting/film/video in the Last Year:   . Barista in the Last Year:   Transportation Needs:   . Freight forwarder (Medical):   Marland Kitchen Lack of Transportation (Non-Medical):   Physical Activity:   . Days of Exercise per Week:   . Minutes of Exercise per Session:   Stress:   . Feeling of Stress :   Social Connections:   . Frequency of Communication with Friends and Family:   . Frequency of Social Gatherings with Friends and Family:   . Attends Religious Services:   . Active Member of Clubs or Organizations:   . Attends Banker Meetings:   Marland Kitchen Marital Status:   Intimate Partner Violence:   . Fear of Current or Ex-Partner:   . Emotionally Abused:   Marland Kitchen Physically Abused:   . Sexually Abused:     Past Medical History, Surgical history, Social history, and Family history were reviewed and updated as appropriate.   Please see review of systems for further details on the patient's review from today.   Objective:   Physical Exam:  BP 134/81   Pulse 81   Physical Exam Constitutional:      General: She is not in acute distress. Musculoskeletal:        General: No deformity.  Neurological:     Mental Status: She is alert and oriented to person, place, and time.     Coordination: Coordination normal.  Psychiatric:        Attention and Perception: Attention and perception normal. She does not perceive auditory or visual hallucinations.        Mood and Affect: Mood is not anxious. Affect is not labile, blunt, angry or  inappropriate.        Speech: Speech normal.        Behavior: Behavior normal.        Thought Content: Thought content normal. Thought content is not paranoid or delusional. Thought content does not include homicidal or suicidal ideation. Thought content does not include homicidal or suicidal plan.        Cognition and Memory: Cognition and memory normal.        Judgment: Judgment normal.     Comments: Insight intact Mood is mildly depressed     Lab Review:  No results found for: NA, K, CL, CO2, GLUCOSE, BUN, CREATININE, CALCIUM, PROT, ALBUMIN, AST, ALT, ALKPHOS, BILITOT,  GFRNONAA, GFRAA     Component Value Date/Time   WBC 10.1 08/22/2014 1405   RBC 5.42 08/22/2014 1405   HGB 14.8 08/22/2014 1405   HCT 45.2 08/22/2014 1405   MCV 83.4 08/22/2014 1405   MCH 27.3 08/22/2014 1405   MCHC 32.7 08/22/2014 1405    No results found for: POCLITH, LITHIUM   No results found for: PHENYTOIN, PHENOBARB, VALPROATE, CBMZ   .res Assessment: Plan:   Pt reports that she would prefer to continue current medications without changes and reports that overall her mood has been stable.  Continue Prozac 20 mg po qd for depression and anxiety.  Continue Lithium 300 mg po QHS for mood s/s.  Continue Adderall as needed. Pt has scripts still on file.  Will d/c Dayvigo due to excessive daytime somnolence.  Pt to f/u in 3 months or sooner if clinically indicated.  Continue therapy with [name], [facility]. Patient advised to contact office with any questions, adverse effects, or acute worsening in signs and symptoms.  Candee was seen today for follow-up.  Diagnoses and all orders for this visit:  Mild episode of recurrent major depressive disorder (HCC) -     FLUoxetine (PROZAC) 20 MG capsule; TAKE 1 CAPSULE(20 MG) BY MOUTH DAILY -     lithium carbonate 300 MG capsule; TAKE 1 CAPSULE(300 MG) BY MOUTH AT BEDTIME  Generalized anxiety disorder -     FLUoxetine (PROZAC) 20 MG capsule; TAKE 1 CAPSULE(20  MG) BY MOUTH DAILY     Please see After Visit Summary for patient specific instructions.  Future Appointments  Date Time Provider [place]  07/29/2020 12:45 PM Thayer Headings, PMHNP CP-CP None    No orders of the defined types were placed in this encounter.   -------------------------------

## 2020-07-29 ENCOUNTER — Other Ambulatory Visit: Payer: Self-pay

## 2020-07-29 ENCOUNTER — Encounter: Payer: Self-pay | Admitting: Psychiatry

## 2020-07-29 ENCOUNTER — Ambulatory Visit (INDEPENDENT_AMBULATORY_CARE_PROVIDER_SITE_OTHER): Payer: BC Managed Care – PPO | Admitting: Psychiatry

## 2020-07-29 DIAGNOSIS — F411 Generalized anxiety disorder: Secondary | ICD-10-CM | POA: Diagnosis not present

## 2020-07-29 DIAGNOSIS — F330 Major depressive disorder, recurrent, mild: Secondary | ICD-10-CM | POA: Diagnosis not present

## 2020-07-29 DIAGNOSIS — Z23 Encounter for immunization: Secondary | ICD-10-CM | POA: Diagnosis not present

## 2020-07-29 DIAGNOSIS — Z0000 Encounter for general adult medical examination without abnormal findings: Secondary | ICD-10-CM | POA: Diagnosis not present

## 2020-07-29 DIAGNOSIS — F900 Attention-deficit hyperactivity disorder, predominantly inattentive type: Secondary | ICD-10-CM

## 2020-07-29 MED ORDER — AMPHETAMINE-DEXTROAMPHETAMINE 10 MG PO TABS: ORAL_TABLET | ORAL | 0 refills | Status: DC

## 2020-07-29 MED ORDER — LITHIUM CARBONATE 300 MG PO CAPS: ORAL_CAPSULE | ORAL | 1 refills | Status: DC

## 2020-07-29 MED ORDER — FLUOXETINE HCL 20 MG PO CAPS: ORAL_CAPSULE | ORAL | 1 refills | Status: DC

## 2020-07-29 MED ORDER — ALPRAZOLAM 0.5 MG PO TABS: ORAL_TABLET | ORAL | 0 refills | Status: DC

## 2020-07-29 NOTE — Progress Notes (Signed)
Jennifer Clements [id number] 10/22/1986 34 y.o.  Subjective:   Patient ID:  Jennifer Clements is a 34 y.o. (DOB [birth year]) female.  Chief Complaint: No chief complaint on file.   HPI Jennifer Clements presents to the office today for follow-up of depression, ADHD, Anxiety, and insomnia. She has noticed some depression and that it could be situational. She reports that her anxiety is "not worse than expected." Had anxiety attack after a long stressful day. She reports that overall anxiety has been manageable. She reports that her sleep has been fair. Some stress dreams. Estimates sleeping 6-7 hours a night. Appetite has been normal. Energy and motivation have been ok with occasional low days. Concentration has been adequate. Stopped taking Adderall for about a week because she had a persistent headache that started when she did not take Adderall and it stopped when she spent time outside. Taking Adderall about 4 out of 5 days and not on weekends. She reports one incident of fleeting passive suicidal thoughts. Denies SI.   Her uncle has COVID and is hospitalized and about to be on a ventilator. She reports that she has been trying not to work over-time and work more regular hours.    Past Psychiatric Medication Trials: Prozac- Effective for anxiety. Took low dose, possibly 10 mg. Does not recall side effects.  Zoloft- Effective and then no longer seemed to be as effective. Had discontinuation s/s. Stopped in 2015  Adderall IR- Effective. Does not recall side effects. Typically felt more calm. May have taken 10 mg 1/2-1 tab BID Trazodone- Ineffective Silenor Dayvigo- Excessive daytime somnolence Xanax- helpful for insomnia.   Review of Systems:  Review of Systems  Cardiovascular: Negative for palpitations.  Musculoskeletal: Negative for gait problem.  Neurological: Negative for tremors.       Recent headache  Psychiatric/Behavioral:       Please refer to HPI    Medications: I have reviewed the  patient's current medications.  Current Outpatient Medications  Medication Sig Dispense Refill  . cholecalciferol (VITAMIN D3) 25 MCG (1000 UT) tablet Take 1,000 Units by mouth daily.    Marland Kitchen FLUoxetine (PROZAC) 20 MG capsule TAKE 1 CAPSULE(20 MG) BY MOUTH DAILY 90 capsule 1  . ibuprofen (ADVIL) 200 MG tablet Take 200 mg by mouth every 6 (six) hours as needed.    . lithium carbonate 300 MG capsule TAKE 1 CAPSULE(300 MG) BY MOUTH AT BEDTIME 90 capsule 1  . Multiple Vitamins-Minerals (MULTIVITAMIN WOMEN PO) Take by mouth.    . norethindrone-ethinyl estradiol (LOESTRIN) 1-20 MG-MCG tablet TK 1 T PO QD    . ALPRAZolam (XANAX) 0.5 MG tablet Take 1/2-1 tab po qd prn panic 5 tablet 0  . [START ON 09/23/2020] amphetamine-dextroamphetamine (ADDERALL) 10 MG tablet Take 1/2-1 tab po BID 60 tablet 0  . [START ON 08/26/2020] amphetamine-dextroamphetamine (ADDERALL) 10 MG tablet Take 1/2-1 tab po BID 60 tablet 0  . amphetamine-dextroamphetamine (ADDERALL) 10 MG tablet Take 1/2-1 tab po BID 60 tablet 0   No current facility-administered medications for this visit.    Medication Side Effects: None  Allergies: No Known Allergies  Past Medical History:  Diagnosis Date  . Depression     Family History  Problem Relation Age of Onset  . Hashimoto's thyroiditis Mother   . Depression Mother   . Mental illness Sister   . Mood Disorder Sister   . Anxiety disorder Sister   . Hypertension Maternal Grandmother   . Heart disease Maternal Grandfather   . Alcohol abuse Paternal  Grandfather   . Mental illness Maternal Aunt   . Alcohol abuse Paternal Aunt   . Depression Paternal Aunt   . Personality disorder Cousin     Social History   Socioeconomic History  . Marital status: Single    Spouse name: Not on file  . Number of children: Not on file  . Years of education: Not on file  . Highest education level: Not on file  Occupational History  . Not on file  Tobacco Use  . Smoking status: Former Games developer   . Smokeless tobacco: Never Used  Vaping Use  . Vaping Use: Never used  Substance and Sexual Activity  . Alcohol use: Yes    Alcohol/week: 2.0 standard drinks    Types: 2 Glasses of wine per week  . Drug use: Never  . Sexual activity: Not on file  Other Topics Concern  . Not on file  Social History Narrative  . Not on file   Social Determinants of Health   Financial Resource Strain:   . Difficulty of Paying Living Expenses: Not on file  Food Insecurity:   . Worried About Programme researcher, broadcasting/film/video in the Last Year: Not on file  . Ran Out of Food in the Last Year: Not on file  Transportation Needs:   . Lack of Transportation (Medical): Not on file  . Lack of Transportation (Non-Medical): Not on file  Physical Activity:   . Days of Exercise per Week: Not on file  . Minutes of Exercise per Session: Not on file  Stress:   . Feeling of Stress : Not on file  Social Connections:   . Frequency of Communication with Friends and Family: Not on file  . Frequency of Social Gatherings with Friends and Family: Not on file  . Attends Religious Services: Not on file  . Active Member of Clubs or Organizations: Not on file  . Attends Banker Meetings: Not on file  . Marital Status: Not on file  Intimate Partner Violence:   . Fear of Current or Ex-Partner: Not on file  . Emotionally Abused: Not on file  . Physically Abused: Not on file  . Sexually Abused: Not on file    Past Medical History, Surgical history, Social history, and Family history were reviewed and updated as appropriate.   Please see review of systems for further details on the patient's review from today.   Objective:   Physical Exam:  BP 125/79   Pulse 93   Physical Exam Constitutional:      General: She is not in acute distress. Musculoskeletal:        General: No deformity.  Neurological:     Mental Status: She is alert and oriented to person, place, and time.     Coordination: Coordination normal.   Psychiatric:        Attention and Perception: Attention and perception normal. She does not perceive auditory or visual hallucinations.        Mood and Affect: Mood normal. Mood is not anxious or depressed. Affect is not labile, blunt, angry or inappropriate.        Speech: Speech normal.        Behavior: Behavior normal.        Thought Content: Thought content normal. Thought content is not paranoid or delusional. Thought content does not include homicidal or suicidal ideation. Thought content does not include homicidal or suicidal plan.        Cognition and Memory: Cognition and memory  normal.        Judgment: Judgment normal.     Comments: Insight intact     Lab Review:  No results found for: NA, K, CL, CO2, GLUCOSE, BUN, CREATININE, CALCIUM, PROT, ALBUMIN, AST, ALT, ALKPHOS, BILITOT, GFRNONAA, GFRAA     Component Value Date/Time   WBC 10.1 08/22/2014 1405   RBC 5.42 08/22/2014 1405   HGB 14.8 08/22/2014 1405   HCT 45.2 08/22/2014 1405   MCV 83.4 08/22/2014 1405   MCH 27.3 08/22/2014 1405   MCHC 32.7 08/22/2014 1405    No results found for: POCLITH, LITHIUM   No results found for: PHENYTOIN, PHENOBARB, VALPROATE, CBMZ   .res Assessment: Plan:   Will continue current plan of care since target signs and symptoms are well controlled without any tolerability issues. Continue Adderall 10 mg 1/2-1 tab po BID for ADHD Continue Prozac 20 mg po qd for depression and anxiety.  Xanax 0.5 mg 1/2-1 tab po qd prn anxiety.  Recommend continuing therapy with Stevphen Meuse, [facility]. Patient advised to contact office with any questions, adverse effects, or acute worsening in signs and symptoms.  Diagnoses and all orders for this visit:  Attention deficit hyperactivity disorder (ADHD), predominantly inattentive type -     amphetamine-dextroamphetamine (ADDERALL) 10 MG tablet; Take 1/2-1 tab po BID -     amphetamine-dextroamphetamine (ADDERALL) 10 MG tablet; Take 1/2-1 tab po BID -      amphetamine-dextroamphetamine (ADDERALL) 10 MG tablet; Take 1/2-1 tab po BID  Mild episode of recurrent major depressive disorder (HCC) -     FLUoxetine (PROZAC) 20 MG capsule; TAKE 1 CAPSULE(20 MG) BY MOUTH DAILY -     lithium carbonate 300 MG capsule; TAKE 1 CAPSULE(300 MG) BY MOUTH AT BEDTIME  Generalized anxiety disorder -     FLUoxetine (PROZAC) 20 MG capsule; TAKE 1 CAPSULE(20 MG) BY MOUTH DAILY  Other orders -     ALPRAZolam (XANAX) 0.5 MG tablet; Take 1/2-1 tab po qd prn panic     Please see After Visit Summary for patient specific instructions.  Future Appointments  Date Time Provider Department Center  08/26/2020 11:00 AM Stevphen Meuse, [facility] CP-CP None  10/28/2020 11:00 AM Corie Chiquito, PMHNP CP-CP None    No orders of the defined types were placed in this encounter.   -------------------------------

## 2020-08-08 DIAGNOSIS — Z23 Encounter for immunization: Secondary | ICD-10-CM | POA: Diagnosis not present

## 2020-08-08 DIAGNOSIS — Z0000 Encounter for general adult medical examination without abnormal findings: Secondary | ICD-10-CM | POA: Diagnosis not present

## 2020-08-26 ENCOUNTER — Other Ambulatory Visit: Payer: Self-pay

## 2020-08-26 ENCOUNTER — Ambulatory Visit (INDEPENDENT_AMBULATORY_CARE_PROVIDER_SITE_OTHER): Payer: BC Managed Care – PPO | Admitting: Psychiatry

## 2020-08-26 DIAGNOSIS — F330 Major depressive disorder, recurrent, mild: Secondary | ICD-10-CM | POA: Diagnosis not present

## 2020-08-26 NOTE — Progress Notes (Signed)
[facility] Counselor/Therapist Progress Note  Patient ID: Jordi Kamm, MRN: [id number],    Date: 08/26/2020  Time Spent: 52 minutes start time 11:05 AM end time 11:57 AM  Treatment Type: Individual Therapy  Reported Symptoms: anxiety, sadness, irritability  Mental Status Exam:  Appearance:   Well Groomed     Behavior:  Appropriate  Motor:  Normal  Speech/Language:   Normal Rate  Affect:  Appropriate  Mood:  normal  Thought process:  normal  Thought content:    WNL  Sensory/Perceptual disturbances:    WNL  Orientation:  oriented to person, place, time/date and situation  Attention:  Good  Concentration:  Good  Memory:  WNL  Fund of knowledge:   Good  Insight:    Good  Judgment:   Good  Impulse Control:  Good   Risk Assessment: Danger to Self:  No Self-injurious Behavior: No Danger to Others: No Duty to Warn:no Physical Aggression / Violence:No  Access to Firearms a concern: No  Gang Involvement:No   Subjective: Patient was present for session. She reported she has been up and down recently.   She explained that she has been having some health issues but things seem okay overall.  Her thyroid seems to be okay.  Her sister was struggling and she had a hard time but she was able to get back on track with therapy and meds. In September her uncle and his driver and maid were arrested in [place] and it was upsetting.  Patient went on to explain that things seem to be okay but it was very difficult because this was not the first time her uncle had been arrested.  Patient went on to share that another uncle who was very loved in her family ended up passing away and that was very devastating for her whole family.  Patient explained that she is been trying to support her cousins and her mother but it is been a very difficult time.  Her biggest concern is still work.  She shared she is learning how to do many tasks and that has been good for her but she is still very concerned  about how things are handled and whether or not clients are getting the the attention they are needing.  Encourage patient to remind herself she can only focus on the things that she can control fix and change and she has to out let go of the things are out of her control.  Patient reported she tries to do that but it is very hard not to feel the pressure of people's lives.  Different CBT filters to try and help her maintain healthy perspective and take care of herself were discussed in session.  Patient was also encouraged to start preparing for seasonal issues since she does seem to have more problems with depression in the winter.  She discussed the fact that she will look into getting a light bar to try and help her.  Patient was also encouraged to try and find a few minutes throughout the day to get some sunlight even when it feels very overwhelming.  Interventions: Cognitive Behavioral Therapy and Solution-Oriented/Positive Psychology  Diagnosis:   ICD-10-CM   1. Mild episode of recurrent major depressive disorder (HCC)  F33.0     Plan: Patient is to use CBT and coping skills to decrease depression symptoms.  Patient is to look into pursuing a light bar to start utilizing as well as medication to decrease her depression patient is  also to work in getting outside even for a few minutes throughout the day.  Patient is to remind herself to focus on the things that she can control fix and change. Long-term goal: Develop healthy cognitive patterns and believes about self in the world that lead to alleviation and help prevent the relapse of depression symptoms Short-term goal: Implement a regular exercise regimen as a depression reduction technique.  Identify and replace depressive thinking that leads to depressive feelings and actions  Stevphen Meuse, [facility]

## 2020-09-23 ENCOUNTER — Ambulatory Visit (INDEPENDENT_AMBULATORY_CARE_PROVIDER_SITE_OTHER): Payer: BC Managed Care – PPO | Admitting: Psychiatry

## 2020-09-23 ENCOUNTER — Other Ambulatory Visit: Payer: Self-pay

## 2020-09-23 DIAGNOSIS — F330 Major depressive disorder, recurrent, mild: Secondary | ICD-10-CM

## 2020-09-23 DIAGNOSIS — H43393 Other vitreous opacities, bilateral: Secondary | ICD-10-CM | POA: Diagnosis not present

## 2020-09-23 DIAGNOSIS — H5213 Myopia, bilateral: Secondary | ICD-10-CM | POA: Diagnosis not present

## 2020-09-23 NOTE — Progress Notes (Signed)
      [facility] Counselor/Therapist Progress Note  Patient ID: Jennifer Clements, MRN: [id number],    Date: 09/23/2020  Time Spent: 52 minutes start time 1:08 PM end time 2 PM  Treatment Type: Individual Therapy  Reported Symptoms: anxiety, sadness, sleep issues, low motivation  Mental Status Exam:  Appearance:   Casual and Neat     Behavior:  Appropriate  Motor:  Normal  Speech/Language:   Normal Rate  Affect:  Appropriate  Mood:  sad  Thought process:  normal  Thought content:    WNL  Sensory/Perceptual disturbances:    WNL  Orientation:  oriented to person, place, time/date and situation  Attention:  Good  Concentration:  Good  Memory:  WNL  Fund of knowledge:   Good  Insight:    Good  Judgment:   Good  Impulse Control:  Good   Risk Assessment: Danger to Self:  No Self-injurious Behavior: No Danger to Others: No Duty to Warn:no Physical Aggression / Violence:No  Access to Firearms a concern: No  Gang Involvement:No   Subjective: patient was present for session.  She shared that election in her country took place and her family is okay so she is resolved.  She went on to share that she is struggling with some motivation. She shared she is thinking it has to do with seasonal issues. 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 She explained it was very stressful and she lost it a little because it got out of hand.   Patient was allowed time to process what occurred over the weekend and then encouraged to figure out what was the most difficult part for her.  She shared the interaction with her sister was the most difficult.  Discussed different ways that she could communicate her concerns and needs with her sister appropriately.  Patient reported that she is fearful it will just turn into something bad and then she would feel guilty and sad.  Ways to deal with that were addressed with patient.  Patient went on to share the holidays are going to be similar  to the weekend and that is very triggering for her.  Discussed some ways that she can talk her self through holiday situations and set appropriate limits with family members who have tried pressure her and to feel a certain way.  Patient agreed to follow plans from session.  She is also to get back to exercising regularly or at least having some's time outside daily.  Interventions: Cognitive Behavioral Therapy and Solution-Oriented/Positive Psychology  Diagnosis:   ICD-10-CM   1. Mild episode of recurrent major depressive disorder (HCC)  F33.0     Plan: Patient is to use CBT and coping skills to decrease depression symptoms.  Patient is to communicate appropriate needs with family members as well as working on setting appropriate limits.  Patient is to work on getting outside and or exercising daily.  Patient is to take medication as directed. Long-term goal: Develop healthy cognitive patterns and believes about self in the world that lead to alleviation and help prevent the relapse of depression symptoms Short-term goal: Implement a regular exercise regimen as a depression reduction technique.  Identify and replace depressive thinking that leads to depressive feelings and actions  Stevphen Meuse, [facility]

## 2020-10-28 ENCOUNTER — Other Ambulatory Visit: Payer: Self-pay

## 2020-10-28 ENCOUNTER — Encounter: Payer: Self-pay | Admitting: Psychiatry

## 2020-10-28 ENCOUNTER — Ambulatory Visit (INDEPENDENT_AMBULATORY_CARE_PROVIDER_SITE_OTHER): Payer: BC Managed Care – PPO | Admitting: Psychiatry

## 2020-10-28 DIAGNOSIS — F411 Generalized anxiety disorder: Secondary | ICD-10-CM | POA: Diagnosis not present

## 2020-10-28 DIAGNOSIS — F330 Major depressive disorder, recurrent, mild: Secondary | ICD-10-CM

## 2020-10-28 MED ORDER — ALPRAZOLAM 0.5 MG PO TABS: ORAL_TABLET | ORAL | 2 refills | Status: DC

## 2020-10-28 MED ORDER — FLUOXETINE HCL 10 MG PO CAPS: 30.0000 mg | ORAL_CAPSULE | Freq: Every day | ORAL | 0 refills | Status: DC

## 2020-10-28 NOTE — Progress Notes (Signed)
      [facility] Counselor/Therapist Progress Note  Patient ID: Jennifer Clements, MRN: [id number],    Date: 10/28/2020  Time Spent: 52 minutes start time 12:58 PM end time 1:50 PM  Treatment Type: Individual Therapy  Reported Symptoms: anxiety, sadness, hopeless, fatigue, low motivation, irritability  Mental Status Exam:  Appearance:   Casual and Neat     Behavior:  Appropriate  Motor:  Normal  Speech/Language:   Normal Rate  Affect:  Appropriate  Mood:  normal  Thought process:  normal  Thought content:    WNL  Sensory/Perceptual disturbances:    WNL  Orientation:  oriented to person, place, time/date and situation  Attention:  Good  Concentration:  Good  Memory:  WNL  Fund of knowledge:   Good  Insight:    Good  Judgment:   Good  Impulse Control:  Good   Risk Assessment: Danger to Self:  No Self-injurious Behavior: No Danger to Others: No Duty to Warn:no Physical Aggression / Violence:No  Access to Firearms a concern: No  Gang Involvement:No   Subjective: Patient was present for session.  She shared it has been a hard month due to change in time and another year in a pandemic.  She shared she is increasing her Prozac and hoping that will help. She went on to share that she is tired of worrying about other people. Reported feeling guilt over not being able to do what she needs to at work and with others.  Patient was encouraged to think through when the breakdown started.  She shared there were a few incidents that have triggered her questioning about why she is still working where she is and what she needs to do.  Patient decided to do EMDR set on the issue since that seems to be creating some of the sadness.  Did EMDR set on Alex's incompetence, suds level 10, negative cognition "I am not good enough" felt overwhelmed anger shame in her stomach and chest.  Patient was able to reduce suds level to 5.  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  Patient was able to think of how to break down the task into pieces that were reasonable so that she can get things moving in a positive direction.  Interventions: Solution-Oriented/Positive Psychology and Eye Movement Desensitization and Reprocessing (EMDR)  Diagnosis:   ICD-10-CM   1. Mild episode of recurrent major depressive disorder (HCC)  F33.0     Plan: Patient is to use CBT and coping skills to decrease depression symptoms.  Patient is to start working on researching options for her future including going back to school, finding a new job, or thinking about being a Brewing technologist.  Patient is to take medication as directed.  Patient is to set limits with family members at Christmas as needed. Long-term goal: Develop healthy cognitive patterns and beliefs about self in the world that lead to alleviation and help prevent the relapse of depression symptoms Short-term goal: Implement a regular exercise regimen as a depression reduction technique.  Identify and replace depressive thinking that leads to depressive feelings and actions  Stevphen Meuse, [facility]

## 2020-10-28 NOTE — Progress Notes (Signed)
Jennifer Clements [id number] Nov 08, 1986 34 y.o.  Subjective:   Patient ID:  Jennifer Clements is a 34 y.o. (DOB [birth year]) female.  Chief Complaint:  Chief Complaint  Patient presents with  . Anxiety  . Depression  . ADD    HPI Jennifer Clements presents to the office today for follow-up of anxiety, depression, and insomnia. She asks about increase in Prozac since she has noticed some worsening anxiety with season changes. Denies any physical s/s with anxiety. She reports "feelings of stress about accomplishing minor things" that can lead to avoidance and "wanting to shut down." She reports that her mood may also be lower. She reports sad mood and some irritability. Energy and motivation have been lower. She reports that she is pushing herself to do things she enjoys and then able to enjoy them once she gets started. Sleep has been "so, so." Sleep amount varies and is sometimes "too much and sometimes not enough." Appetite has been normal to slightly low. Describes concentration as "patchy" and has needed Adderall more than usual. She reports that she is sometimes not completing things at work. Has been assigned some tasks that she has not done previously. Reports some occasional passive death wishes without suicidal intent or plan.    Took Xanax prn x 1 when she had increased anxiety and some catastrophic thoughts. She has been taking Adderall 1 tab po q am and an additional 1/2 tab in the afternoon, and was previously taking 1/2 tab BID prn.   Past Psychiatric Medication Trials: Prozac- Effective for anxiety. Took low dose, possibly 10 mg. Does not recall side effects.  Zoloft- Effective and then no longer seemed to be as effective. Had discontinuation s/s. Stopped in 2015  Adderall IR- Effective. Does not recall side effects. Typically felt more calm. May have taken 10 mg 1/2-1 tab BID Trazodone- Ineffective Silenor Dayvigo- Excessive daytime somnolence Xanax- helpful for insomnia.   Review of  Systems:  Review of Systems  Gastrointestinal: Negative.   Musculoskeletal: Negative for gait problem.  Neurological: Negative for tremors.  Psychiatric/Behavioral:       Please refer to HPI    Medications: I have reviewed the patient's current medications.  Current Outpatient Medications  Medication Sig Dispense Refill  . amphetamine-dextroamphetamine (ADDERALL) 10 MG tablet Take 1/2-1 tab po BID 60 tablet 0  . cholecalciferol (VITAMIN D3) 25 MCG (1000 UT) tablet Take 1,000 Units by mouth daily.    Marland Kitchen ibuprofen (ADVIL) 200 MG tablet Take 200 mg by mouth every 6 (six) hours as needed.    . lithium carbonate 300 MG capsule TAKE 1 CAPSULE(300 MG) BY MOUTH AT BEDTIME 90 capsule 1  . Multiple Vitamins-Minerals (MULTIVITAMIN WOMEN PO) Take by mouth.    . norethindrone-ethinyl estradiol (LOESTRIN) 1-20 MG-MCG tablet TK 1 T PO QD    . ALPRAZolam (XANAX) 0.5 MG tablet Take 1/2-1 tab po qd prn panic 5 tablet 2  . amphetamine-dextroamphetamine (ADDERALL) 10 MG tablet Take 1/2-1 tab po BID 60 tablet 0  . amphetamine-dextroamphetamine (ADDERALL) 10 MG tablet Take 1/2-1 tab po BID 60 tablet 0  . FLUoxetine (PROZAC) 10 MG capsule Take 3 capsules (30 mg total) by mouth daily. 270 capsule 0   No current facility-administered medications for this visit.    Medication Side Effects: None  Allergies: No Known Allergies  Past Medical History:  Diagnosis Date  . Depression     Family History  Problem Relation Age of Onset  . Hashimoto's thyroiditis Mother   . Depression Mother   .  Mental illness Sister   . Mood Disorder Sister   . Anxiety disorder Sister   . Hypertension Maternal Grandmother   . Heart disease Maternal Grandfather   . Alcohol abuse Paternal Grandfather   . Mental illness Maternal Aunt   . Alcohol abuse Paternal Aunt   . Depression Paternal Aunt   . Personality disorder Cousin     Social History   Socioeconomic History  . Marital status: Single    Spouse name: Not on  file  . Number of children: Not on file  . Years of education: Not on file  . Highest education level: Not on file  Occupational History  . Not on file  Tobacco Use  . Smoking status: Former Games developer  . Smokeless tobacco: Never Used  Vaping Use  . Vaping Use: Never used  Substance and Sexual Activity  . Alcohol use: Yes    Alcohol/week: 2.0 standard drinks    Types: 2 Glasses of wine per week  . Drug use: Never  . Sexual activity: Not on file  Other Topics Concern  . Not on file  Social History Narrative  . Not on file   Social Determinants of Health   Financial Resource Strain: Not on file  Food Insecurity: Not on file  Transportation Needs: Not on file  Physical Activity: Not on file  Stress: Not on file  Social Connections: Not on file  Intimate Partner Violence: Not on file    Past Medical History, Surgical history, Social history, and Family history were reviewed and updated as appropriate.   Please see review of systems for further details on the patient's review from today.   Objective:   Physical Exam:  There were no vitals taken for this visit.  Physical Exam Constitutional:      General: She is not in acute distress. Musculoskeletal:        General: No deformity.  Neurological:     Mental Status: She is alert and oriented to person, place, and time.     Coordination: Coordination normal.  Psychiatric:        Attention and Perception: Attention and perception normal. She does not perceive auditory or visual hallucinations.        Mood and Affect: Mood is anxious and depressed. Affect is not labile, blunt, angry or inappropriate.        Speech: Speech normal.        Behavior: Behavior normal.        Thought Content: Thought content normal. Thought content is not paranoid or delusional. Thought content does not include homicidal or suicidal ideation. Thought content does not include homicidal or suicidal plan.        Cognition and Memory: Cognition and  memory normal.        Judgment: Judgment normal.     Comments: Insight intact     Lab Review:  No results found for: NA, K, CL, CO2, GLUCOSE, BUN, CREATININE, CALCIUM, PROT, ALBUMIN, AST, ALT, ALKPHOS, BILITOT, GFRNONAA, GFRAA     Component Value Date/Time   WBC 10.1 08/22/2014 1405   RBC 5.42 08/22/2014 1405   HGB 14.8 08/22/2014 1405   HCT 45.2 08/22/2014 1405   MCV 83.4 08/22/2014 1405   MCH 27.3 08/22/2014 1405   MCHC 32.7 08/22/2014 1405    No results found for: POCLITH, LITHIUM   No results found for: PHENYTOIN, PHENOBARB, VALPROATE, CBMZ   .res Assessment: Plan:    Patient seen for 30 minutes and time spent discussing potential  benefits, risks, and side effects of increasing Prozac to 30 mg daily to improve mood and anxiety.  Patient agrees to increase in Prozac to 30 mg daily. Continue Xanax 0.5 mg 1/2 to 1 tablet daily as needed for panic. Continue Adderall 10 mg as needed for attention deficit disorder. Continue lithium 300 mg at bedtime for mood. Patient to follow-up in 2 months or sooner if clinically indicated. Patient advised to contact office with any questions, adverse effects, or acute worsening in signs and symptoms.   Sonika was seen today for anxiety, depression and add.  Diagnoses and all orders for this visit:  Mild episode of recurrent major depressive disorder (HCC) -     FLUoxetine (PROZAC) 10 MG capsule; Take 3 capsules (30 mg total) by mouth daily.  Generalized anxiety disorder -     FLUoxetine (PROZAC) 10 MG capsule; Take 3 capsules (30 mg total) by mouth daily. -     ALPRAZolam (XANAX) 0.5 MG tablet; Take 1/2-1 tab po qd prn panic     Please see After Visit Summary for patient specific instructions.  Future Appointments  Date Time Provider Department Center  12/02/2020 11:00 AM Stevphen Meuse, [facility] None  12/30/2020  1:30 PM Corie Chiquito, PMHNP [facility] None  12/30/2020  3:00 PM Stevphen Meuse, [facility] None    No orders of  the defined types were placed in this encounter.   -------------------------------

## 2020-12-02 ENCOUNTER — Ambulatory Visit: Payer: BC Managed Care – PPO | Admitting: Psychiatry

## 2020-12-30 ENCOUNTER — Other Ambulatory Visit: Payer: Self-pay

## 2020-12-30 ENCOUNTER — Ambulatory Visit (INDEPENDENT_AMBULATORY_CARE_PROVIDER_SITE_OTHER): Payer: BC Managed Care – PPO | Admitting: Psychiatry

## 2020-12-30 ENCOUNTER — Ambulatory Visit: Payer: BC Managed Care – PPO | Admitting: Psychiatry

## 2020-12-30 DIAGNOSIS — F330 Major depressive disorder, recurrent, mild: Secondary | ICD-10-CM | POA: Diagnosis not present
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
## 2021-01-02 DIAGNOSIS — Z23 Encounter for immunization: Secondary | ICD-10-CM | POA: Diagnosis not present

## 2021-01-02 DIAGNOSIS — Z01419 Encounter for gynecological examination (general) (routine) without abnormal findings: Secondary | ICD-10-CM | POA: Diagnosis not present

## 2021-01-02 DIAGNOSIS — Z124 Encounter for screening for malignant neoplasm of cervix: Secondary | ICD-10-CM | POA: Diagnosis not present

## 2021-02-10 ENCOUNTER — Ambulatory Visit (INDEPENDENT_AMBULATORY_CARE_PROVIDER_SITE_OTHER): Payer: BC Managed Care – PPO | Admitting: Psychiatry

## 2021-02-10 ENCOUNTER — Other Ambulatory Visit: Payer: Self-pay

## 2021-02-10 DIAGNOSIS — F330 Major depressive disorder, recurrent, mild: Secondary | ICD-10-CM | POA: Diagnosis not present

## 2021-02-10 NOTE — Progress Notes (Signed)
[facility] Counselor/Therapist Progress Note  Patient ID: Jennifer Clements, MRN: [id number],    Date: 02/10/2021  Time Spent: 50 minutes start time 11:10 AM end time 12 PM  Treatment Type: Individual Therapy  Reported Symptoms: anxiety, sadness, on depression scale of 1-10 with 10 being suicidal she is a 5, isolation,rumintation  Mental Status Exam:  Appearance:   Casual and Neat     Behavior:  Appropriate  Motor:  Normal  Speech/Language:   Normal Rate  Affect:  Appropriate  Mood:  anxious  Thought process:  normal  Thought content:    WNL  Sensory/Perceptual disturbances:    WNL  Orientation:  oriented to person, place, time/date and situation  Attention:  Good  Concentration:  Good  Memory:  WNL  Fund of knowledge:   Good  Insight:    Good  Judgment:   Good  Impulse Control:  Good   Risk Assessment: Danger to Self:  No Self-injurious Behavior: No Danger to Others: No Duty to Warn:no Physical Aggression / Violence:No  Access to Firearms a concern: No  Gang Involvement:No   Subjective: Patient was present for session.  She shared she is very stressed at work and she wants to change jobs.  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 She is having anxiety over more tests that she will be taking.  Discussed ways for her to prepare for the oral tests that is worrying her.  She was able to realize that her dad would be the best person for her to look towards concerning help, but she is concerned due to the hurt from the past with her father not recognizing her strengths.  Patient shared she feels her multiple times when she is not felt as smart as others in her life.  She shared she just processes information and does things differently.  Had patient do EMDR set on sitting in class not understanding, suds level 10, negative cognition "there is something wrong with me" felt panic in her chest and stomach.  Patient was able to reduce suds level to 5.  She  was able to realize that she was smart and that her friends that she finds very smart do enjoy her and think she is smart as well.  She also recognized that she just learns difference and has different strengths.  She was able to think through strengths that she has that some other people do not have and agreed to try and build on those things.  Patient was encouraged to write out for make a note on her phone of the positive things that she accomplishes each day to read over when she is feeling not as smart.  Interventions: Solution-Oriented/Positive Psychology and Eye Movement Desensitization and Reprocessing (EMDR)  Diagnosis:   ICD-10-CM   1. Mild episode of recurrent major depressive disorder (HCC)  F33.0     Plan: Patient is to use CBT and coping skills to decrease depression symptoms.  Patient is to work on writing down the positive things that she does in a day.  Patient is to remind herself of her strengths regularly.  Patient is to work on exercising to release negative emotions. Long-term goal: Develop healthy cognitive patterns and believes about self in the world that lead to alleviation and help prevent the relapse of depression symptoms Short-term goal: Implement regular exercise regimen as a depressant reduction technique.  Identify and replace depressive thinking that leads to depressive feelings and actions.  Stevphen Meuse, [facility]

## 2021-02-24 ENCOUNTER — Other Ambulatory Visit: Payer: Self-pay

## 2021-02-24 ENCOUNTER — Ambulatory Visit (INDEPENDENT_AMBULATORY_CARE_PROVIDER_SITE_OTHER): Payer: BC Managed Care – PPO | Admitting: Psychiatry

## 2021-02-24 ENCOUNTER — Encounter: Payer: Self-pay | Admitting: Psychiatry

## 2021-02-24 VITALS — BP 132/80 | HR 91

## 2021-02-24 DIAGNOSIS — F331 Major depressive disorder, recurrent, moderate: Secondary | ICD-10-CM | POA: Diagnosis not present

## 2021-02-24 DIAGNOSIS — F900 Attention-deficit hyperactivity disorder, predominantly inattentive type: Secondary | ICD-10-CM | POA: Diagnosis not present

## 2021-02-24 DIAGNOSIS — F411 Generalized anxiety disorder: Secondary | ICD-10-CM | POA: Diagnosis not present

## 2021-02-24 DIAGNOSIS — F330 Major depressive disorder, recurrent, mild: Secondary | ICD-10-CM

## 2021-02-24 MED ORDER — AMPHETAMINE-DEXTROAMPHETAMINE 10 MG PO TABS: ORAL_TABLET | ORAL | 0 refills | Status: DC

## 2021-02-24 MED ORDER — FLUOXETINE HCL 20 MG PO CAPS: ORAL_CAPSULE | ORAL | 1 refills | Status: DC

## 2021-02-24 MED ORDER — DEPLIN 15 15-90.314 MG PO CAPS
15.0000 mg | ORAL_CAPSULE | Freq: Every day | ORAL | 0 refills | Status: DC
Start: 2021-02-24 — End: 2021-05-26

## 2021-02-24 MED ORDER — LITHIUM CARBONATE 300 MG PO CAPS: ORAL_CAPSULE | ORAL | 1 refills | Status: DC

## 2021-02-24 NOTE — Progress Notes (Signed)
Jennifer Clements [id number] 08-21-1986 35 y.o.  Subjective:   Patient ID:  Jennifer Clements is a 35 y.o. (DOB Aug 08, 1986) female.  Chief Complaint:  Chief Complaint  Patient presents with  . Anxiety  . ADHD    HPI Jennifer Clements presents to the office today for follow-up of anxiety, depression, and ADHD. Pt apologetic about being approximately 10 minutes late for apt after her car would not start and had to borrow her friend's vehicle. She reports several recent stressors. She reports some work stress. She reports some physical s/s of anxiety. She reports that she decided not to increase Prozac and questioned if she had more acute, situational anxiety. She reports some intermittent depression- "bad days and good days." She reports that on "bad days" she is still able to get out of bed and do what she needs to do. She reports that motivation is somewhat lower on bad days. She reports energy and motivation have been ok overall. She reports that her sleep has been ok. Appetite has been ok. Concentration is ok when she takes Adderall and is taking it on work days. Notices she is more anxious when she does not take Adderall. Denies SI.   She reports that her sister had pharmacogenetic testing completed and it showed MTHFR mutation and possible result with serotonin transporter.     Past Psychiatric Medication Trials: Prozac- Effective for anxiety. Took low dose, possibly 10 mg. Does not recall side effects.  Zoloft- Effective and then no longer seemed to be as effective. Had discontinuation s/s. Stopped in 2015  Adderall IR- Effective. Does not recall side effects. Typically felt more calm. May have taken 10 mg 1/2-1 tab BID Trazodone- Ineffective Silenor Dayvigo- Excessive daytime somnolence Xanax- helpful for insomnia.  Review of Systems:  Review of Systems  Cardiovascular: Negative for palpitations.  Musculoskeletal: Negative for gait problem.  Neurological: Positive for headaches. Negative for  tremors.  Psychiatric/Behavioral:       Please refer to HPI    Medications: I have reviewed the patient's current medications.  Current Outpatient Medications  Medication Sig Dispense Refill  . L-Methylfolate-Algae (DEPLIN 15) 15-90.314 MG CAPS Take 15 mg by mouth daily. 30 capsule 0  . ALPRAZolam (XANAX) 0.5 MG tablet Take 1/2-1 tab po qd prn panic 5 tablet 2  . amphetamine-dextroamphetamine (ADDERALL) 10 MG tablet Take 1/2-1 tab po BID 60 tablet 0  . amphetamine-dextroamphetamine (ADDERALL) 10 MG tablet Take 1/2-1 tab po BID 60 tablet 0  . [START ON 03/24/2021] amphetamine-dextroamphetamine (ADDERALL) 10 MG tablet Take 1/2-1 tab po BID 60 tablet 0  . cholecalciferol (VITAMIN D3) 25 MCG (1000 UT) tablet Take 1,000 Units by mouth daily.    Marland Kitchen FLUoxetine (PROZAC) 20 MG capsule TAKE 1 CAPSULE(20 MG) BY MOUTH DAILY 90 capsule 1  . ibuprofen (ADVIL) 200 MG tablet Take 200 mg by mouth every 6 (six) hours as needed.    . lithium carbonate 300 MG capsule TAKE 1 CAPSULE(300 MG) BY MOUTH AT BEDTIME 90 capsule 1  . Multiple Vitamins-Minerals (MULTIVITAMIN WOMEN PO) Take by mouth.    . norethindrone-ethinyl estradiol (LOESTRIN) 1-20 MG-MCG tablet TK 1 T PO QD     No current facility-administered medications for this visit.    Medication Side Effects: None  Allergies: No Known Allergies  Past Medical History:  Diagnosis Date  . Depression     Family History  Problem Relation Age of Onset  . Hashimoto's thyroiditis Mother   . Depression Mother   . Mental illness Sister   .  Mood Disorder Sister   . Anxiety disorder Sister   . Hypertension Maternal Grandmother   . Heart disease Maternal Grandfather   . Alcohol abuse Paternal Grandfather   . Mental illness Maternal Aunt   . Alcohol abuse Paternal Aunt   . Depression Paternal Aunt   . Personality disorder Cousin     Social History   Socioeconomic History  . Marital status: Single    Spouse name: Not on file  . Number of children:  Not on file  . Years of education: Not on file  . Highest education level: Not on file  Occupational History  . Not on file  Tobacco Use  . Smoking status: Former Games developer  . Smokeless tobacco: Never Used  Vaping Use  . Vaping Use: Never used  Substance and Sexual Activity  . Alcohol use: Yes    Alcohol/week: 2.0 standard drinks    Types: 2 Glasses of wine per week  . Drug use: Never  . Sexual activity: Not on file  Other Topics Concern  . Not on file  Social History Narrative  . Not on file   Social Determinants of Health   Financial Resource Strain: Not on file  Food Insecurity: Not on file  Transportation Needs: Not on file  Physical Activity: Not on file  Stress: Not on file  Social Connections: Not on file  Intimate Partner Violence: Not on file    Past Medical History, Surgical history, Social history, and Family history were reviewed and updated as appropriate.   Please see review of systems for further details on the patient's review from today.   Objective:   Physical Exam:  BP 132/80   Pulse 91   Physical Exam Constitutional:      General: She is not in acute distress. Musculoskeletal:        General: No deformity.  Neurological:     Mental Status: She is alert and oriented to person, place, and time.     Coordination: Coordination normal.  Psychiatric:        Attention and Perception: Attention and perception normal. She does not perceive auditory or visual hallucinations.        Mood and Affect: Affect is not labile, blunt, angry or inappropriate.        Speech: Speech normal.        Behavior: Behavior normal.        Thought Content: Thought content normal. Thought content is not paranoid or delusional. Thought content does not include homicidal or suicidal ideation. Thought content does not include homicidal or suicidal plan.        Cognition and Memory: Cognition and memory normal.        Judgment: Judgment normal.     Comments: Insight  intact Mood is appropriate to content. Affect is congruent.      Lab Review:  No results found for: NA, K, CL, CO2, GLUCOSE, BUN, CREATININE, CALCIUM, PROT, ALBUMIN, AST, ALT, ALKPHOS, BILITOT, GFRNONAA, GFRAA     Component Value Date/Time   WBC 10.1 08/22/2014 1405   RBC 5.42 08/22/2014 1405   HGB 14.8 08/22/2014 1405   HCT 45.2 08/22/2014 1405   MCV 83.4 08/22/2014 1405   MCH 27.3 08/22/2014 1405   MCHC 32.7 08/22/2014 1405    No results found for: POCLITH, LITHIUM   No results found for: PHENYTOIN, PHENOBARB, VALPROATE, CBMZ   .res Assessment: Plan:    Pt reports that she would prefer to continue Prozac 20 mg daily for  mood and anxiety s/s since she attributes current anxiety to several situational stressors and reports that s/s are currently manageable. Discussed that she can continue Prozac 20 mg po qd with option to increase to 30 mg po qd, even if needed only short-term, to help with acute anxiety.  Pt reports that her sister had pharmacogenetic testing that indicated a mutation with MTHFR and possibly with the serotonin transporter gene, and that pt's mood and anxiety have significantly improved with addition of l-methyl-folate and possibly Trintellix. Discussed option to complete pharmacogenetic testing and provided pt with pamphlet regarding Genesight testing. Also discussed option to start Deplin samples for augmentation of depression. Discussed potential benefits, risks, and side effects of Deplin and pt agrees to trial. Will start Deplin 15 mg po qd for augmentation of depression.  Continue Lithium 300 mg po QHS for chronic SI. Continue Xanax 0.5 mg 1/2 to 1 tablet as needed for severe anxiety. Continue Adderall 10 mg 1/2 to 1 tablet for attention deficit disorder.  Patient has a refill on file and will send additional refill. Recommend continuing psychotherapy with Stevphen Meuse, LC [facility]. Patient advised to contact office with any questions, adverse effects, or acute  worsening in signs and symptoms.   Jennifer Clements was seen today for anxiety and adhd.  Diagnoses and all orders for this visit:  Major depressive disorder, recurrent episode, moderate (HCC) -     L-Methylfolate-Algae (DEPLIN 15) 15-90.314 MG CAPS; Take 15 mg by mouth daily.  Mild episode of recurrent major depressive disorder (HCC) -     lithium carbonate 300 MG capsule; TAKE 1 CAPSULE(300 MG) BY MOUTH AT BEDTIME -     FLUoxetine (PROZAC) 20 MG capsule; TAKE 1 CAPSULE(20 MG) BY MOUTH DAILY  Generalized anxiety disorder -     FLUoxetine (PROZAC) 20 MG capsule; TAKE 1 CAPSULE(20 MG) BY MOUTH DAILY  Attention deficit hyperactivity disorder (ADHD), predominantly inattentive type -     amphetamine-dextroamphetamine (ADDERALL) 10 MG tablet; Take 1/2-1 tab po BID     Please see After Visit Summary for patient specific instructions.  Future Appointments  Date Time Provider Department Center  03/10/2021  1:00 PM Stevphen Meuse, [facility] None  04/07/2021 11:00 AM Stevphen Meuse, [facility] None  05/26/2021 10:00 AM Corie Chiquito, [facility] CP[facility] None    No orders of the defined types were placed in this encounter.   -------------------------------

## 2021-03-03 DIAGNOSIS — Z23 Encounter for immunization: Secondary | ICD-10-CM | POA: Diagnosis not present

## 2021-03-10 ENCOUNTER — Ambulatory Visit (INDEPENDENT_AMBULATORY_CARE_PROVIDER_SITE_OTHER): Payer: BC Managed Care – PPO | Admitting: Psychiatry

## 2021-03-10 ENCOUNTER — Other Ambulatory Visit: Payer: Self-pay

## 2021-03-10 DIAGNOSIS — F331 Major depressive disorder, recurrent, moderate: Secondary | ICD-10-CM

## 2021-03-10 NOTE — Progress Notes (Signed)
[facility] Counselor/Therapist Progress Note  Patient ID: Jennifer Clements, MRN: [id number],    Date: 03/10/2021  Time Spent: 50 minutes start time 1:09 PM end time 1:59 PM  Treatment Type: Individual Therapy  Reported Symptoms: anxiety, depression, frustration,sleep issues, irritability   Mental Status Exam:  Appearance:   Well Groomed     Behavior:  Appropriate  Motor:  Normal  Speech/Language:   Normal Rate  Affect:  Appropriate  Mood:  anxious  Thought process:  normal  Thought content:    WNL  Sensory/Perceptual disturbances:    WNL  Orientation:  oriented to person, place, time/date and situation  Attention:  Good  Concentration:  Good  Memory:  WNL  Fund of knowledge:   Good  Insight:    Good  Judgment:   Good  Impulse Control:  Good   Risk Assessment: Danger to Self:  No Self-injurious Behavior: No Danger to Others: No Duty to Warn:no Physical Aggression / Violence:No  Access to Firearms a concern: No  Gang Involvement:No   Subjective: Patient was present for session.  She shared that her friend has encouraged her to apply for a new job and that is exciting for her at the same time it is bringing up lots of anxiety for her.  She was able to connect where the anxiety is surfacing. She was able to recognize that she has to use her self talk to get herself through the process. She shared that she is recognizing that she has to use her intrusive thoughts as information that she needs to figure what she needs to help her thoughts subside.  Patient was encouraged to feel good about what she is learning.  Discussed the importance of working on making different decisions and changing things in her life that were creating the most anxiety for her.  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  She shared that was huge progress for her  to be able to recognize that that would be an okay thing and she does not have to feel like a failure.  She also shared that when she is able to shift her perspective her mood can improve greatly so different ways to work on that were discussed with patient as well.  She was able to recognize sometimes just a change of scenery or taking some time to breathe and recognizing that beautiful things in life can help her feel better.  The importance of taking time to do that regularly as well as releasing negative emotions appropriately through movement were discussed with patient.  Interventions: Cognitive Behavioral Therapy and Solution-Oriented/Positive Psychology  Diagnosis:   ICD-10-CM   1. Major depressive disorder, recurrent episode, moderate (HCC)  F33.1     Plan: Patient is to use CBT and coping skills to decrease depression symptoms.  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  Patient is to use intrusive thoughts as information to help her figure out what she needs to do next for herself.  Patient is to work on finding a new job.  Patient is to take medication as directed. Long-term goal: Develop healthy cognitive patterns and believes about self in the world that lead to alleviation and help prevent the relapse of depression symptoms Short-term goal: Implement regular exercise regimen as a  depressant reduction technique.  Identify and replace depressive thinking that leads to depressive feelings and actions.  Stevphen Meuse, [facility]

## 2021-04-07 ENCOUNTER — Ambulatory Visit (INDEPENDENT_AMBULATORY_CARE_PROVIDER_SITE_OTHER): Payer: BC Managed Care – PPO | Admitting: Psychiatry

## 2021-04-07 ENCOUNTER — Other Ambulatory Visit: Payer: Self-pay

## 2021-04-07 DIAGNOSIS — F330 Major depressive disorder, recurrent, mild: Secondary | ICD-10-CM | POA: Diagnosis not present

## 2021-04-07 NOTE — Progress Notes (Signed)
[facility] Counselor/Therapist Progress Note  Patient ID: Jennifer Clements, MRN: [id number],    Date: 04/07/2021  Time Spent: 50 minutes start time 11:15 AM end time 12:05 PM  Treatment Type: Individual Therapy  Reported Symptoms: anxiety, panic, intrusive thoughts, sleep issues, anger, sadness  Mental Status Exam:  Appearance:   Well Groomed     Behavior:  Appropriate  Motor:  Normal  Speech/Language:   Normal Rate  Affect:  Appropriate  Mood:  anxious  Thought process:  normal  Thought content:    WNL  Sensory/Perceptual disturbances:    WNL  Orientation:  oriented to person, place, time/date and situation  Attention:  Good  Concentration:  Good  Memory:  WNL  Fund of knowledge:   Good  Insight:    Good  Judgment:   Good  Impulse Control:  Good   Risk Assessment: Danger to Self:  No Self-injurious Behavior: No Danger to Others: No Duty to Warn:no Physical Aggression / Violence:No  Access to Firearms a concern: No  Gang Involvement:No   Subjective: Patient was present for session.  She shared she is struggling with anxiety.  She shared that she is interviewing for a new job.  She is also having lots of stress at work so things feel like a lot.  She is also having issues with her sister due to her not communicating as much and doing things with her so it is triggering for her.  Allowed time for patient to process what has surfaced with her sister and discussed how her feelings were appropriate and understandable.  Different ways to communicate them with her were discussed with patient.  Also patient was encouraged to figure out self talk she can utilize when there is a disconnection and she cannot communicate with her sister as she likes.  Patient discussed some of the stress she is feeling at her work site and why getting a new job is such a pressing issue for her.  The importance of her reminding herself that she has gifts and strengths and that she is going to find  something different soon was discussed with patient.  Ways for patient to talk her self through the interview so that she does not get so overwhelmed were addressed in session.  Patient was able to share different situations with her mother that she has handled well and that overall she feels her mood is better.  Patient was able to acknowledge when she is able to change her perspective things go much better.  Encouraged her to take time to even go outside for short walks or do what she needs to do to ground herself and be able to maintain perspective that things are good to work out and be okay.  Interventions: Cognitive Behavioral Therapy and Solution-Oriented/Positive Psychology  Diagnosis:   ICD-10-CM   1. Mild episode of recurrent major depressive disorder (HCC)  F33.0     Plan: Patient is to use CBT and coping skills to decrease depression symptoms.  Patient is to work on CBT filters to help manage emotions when she cannot communicate with her sister.  As well as to get through her interview.  Patient is to take time to go outside or move to another location in the building,when she needs to get a change in perspective.  Patient is to take medication as directed. Long-term goal: Develop healthy cognitive patterns and believes about self in the world that lead to alleviation and help prevent the relapse of  depression symptoms Short-term goal: Implement regular exercise regimen as a depressant reduction technique. Identify and replace depressive thinking that leads to depressive feelings and actions.  Stevphen Meuse, [facility]

## 2021-05-05 ENCOUNTER — Ambulatory Visit (INDEPENDENT_AMBULATORY_CARE_PROVIDER_SITE_OTHER): Payer: BC Managed Care – PPO | Admitting: Psychiatry

## 2021-05-05 ENCOUNTER — Other Ambulatory Visit: Payer: Self-pay

## 2021-05-05 DIAGNOSIS — F331 Major depressive disorder, recurrent, moderate: Secondary | ICD-10-CM

## 2021-05-05 NOTE — Progress Notes (Signed)
[facility] Counselor/Therapist Progress Note  Patient ID: Celines Femia, MRN: [id number],    Date: 05/05/2021  Time Spent: 57 minutes start time 11:03 AM 12:00 PM  Treatment Type: Individual Therapy  Reported Symptoms: sadness, anxiety, low motivation, triggered responses, intrusive thoughts, panic, irritated, angry, headaches  Mental Status Exam:  Appearance:   Well Groomed     Behavior:  Appropriate  Motor:  Normal  Speech/Language:   Normal Rate  Affect:  Appropriate  Mood:  anxious and sad  Thought process:  normal  Thought content:    WNL  Sensory/Perceptual disturbances:    WNL  Orientation:  oriented to person, place, time/date, and situation  Attention:  Good  Concentration:  Good  Memory:  WNL  Fund of knowledge:   Good  Insight:    Good  Judgment:   Good  Impulse Control:  Good   Risk Assessment: Danger to Self:   intrusive thoughts but contracts for safety at this time Self-injurious Behavior: No Danger to Others: No Duty to Warn:no Physical Aggression / Violence:No  Access to Firearms a concern: No  Gang Involvement:No   Subjective: Patient was present for session.  She shared that she found out she did not get the job she had applied for and that has caused a downward schedule. She stated she got stuck and is trying to bring herself out of it.  She reported that she is realizing she has to get a new job. She shared that things just kept happening wrong and it got overwhelming for her. She is going to be able to get out of town next week and she is hoping that will give her a little down time.  Did EMDR set on finding out she did not get the job, suds level 9, negative cognition "I am stupid to think I will get a good job" felt anger and hurt in her stomach.  Patient was able to reduce suds level to 5.  She was able to recognize that she needs to use her feelings and intrusive thoughts more as information that she needs to keep looking for another new job.   Discussed taking off a few days and doing lots of applications rather than just 1.  Also encouraged patient to focus on the things that she can control fix and change and doing the best job she can at her work currently rather than allowing those automatic negative thoughts to ruminate and feeling like a failure and that she does not matter.  Patient agreed to try and work on her self talk and CBT skills as well as self-care of exercise over the next few weeks.  Interventions: Cognitive Behavioral Therapy, Solution-Oriented/Positive Psychology, and Eye Movement Desensitization and Reprocessing (EMDR)  Diagnosis:   ICD-10-CM   1. Major depressive disorder, recurrent episode, moderate (HCC)  F33.1       Plan: Patient is to use CBT and coping skills to decrease depression symptoms.  Patient is to work on self-care including exercise diet and self talk.  Patient is to apply for multiple different jobs.  The patient is to take medication as directed.  Patient is to go to the ED if intrusive thoughts are to increase and her safety is in danger.  Patient is to take medication as directed. Long-term goal: Develop healthy cognitive patterns and believes about self in the world that lead to alleviation and help prevent the relapse of depression symptoms Short-term goal: Implement regular exercise regimen as a depressant  reduction technique.  Identify and replace depressive thinking that leads to depressive feelings and actions.    Stevphen Meuse, [facility]

## 2021-05-26 ENCOUNTER — Encounter: Payer: Self-pay | Admitting: Psychiatry

## 2021-05-26 ENCOUNTER — Ambulatory Visit (INDEPENDENT_AMBULATORY_CARE_PROVIDER_SITE_OTHER): Payer: BC Managed Care – PPO | Admitting: Psychiatry

## 2021-05-26 ENCOUNTER — Other Ambulatory Visit: Payer: Self-pay

## 2021-05-26 DIAGNOSIS — F330 Major depressive disorder, recurrent, mild: Secondary | ICD-10-CM | POA: Diagnosis not present

## 2021-05-26 DIAGNOSIS — F411 Generalized anxiety disorder: Secondary | ICD-10-CM | POA: Diagnosis not present

## 2021-05-26 MED ORDER — ALPRAZOLAM 0.5 MG PO TABS: ORAL_TABLET | ORAL | 2 refills | Status: DC

## 2021-05-26 NOTE — Progress Notes (Signed)
Jennifer Clements [id number] 1986-10-16 35 y.o.  Subjective:   Patient ID:  Jennifer Clements is a 35 y.o. (DOB 01-08-1986) 35 y.o.  female.  Chief Complaint:  Chief Complaint  Patient presents with   Follow-up    Depression, Anxiety, and ADHD    HPI Jennifer Clements presents to the office today for follow-up of ADHD, depression, and anxiety. She reports that she did not notice any benefit with Deplin. She reports that her mood has been "manageable." Denies persistent depression. Anxiety has also been "manageable" despite some recent stressors. Sleeping well. Appetite has been normal. Concentration has been adequate. Energy and motivation have been ok. Denies SI.   Seeing Stevphen Meuse, [facility] for therapy.   Helped aunt move from [facility].   Past Psychiatric Medication Trials: Prozac- Effective for anxiety. Took low dose, possibly 10 mg. Does not recall side effects. Zoloft- Effective and then no longer seemed to be as effective. Had discontinuation s/s. Stopped in 2015   Adderall IR- Effective. Does not recall side effects. Typically felt more calm. May have taken 10 mg 1/2-1 tab BID Trazodone- Ineffective Silenor Dayvigo- Excessive daytime somnolence Xanax- helpful for insomnia.  Deplin- no improvement.    Review of Systems:  Review of Systems  Cardiovascular:  Negative for palpitations.  Gastrointestinal: Negative.   Musculoskeletal:  Negative for gait problem.  Skin:        She reports "stress related eczema."  Psychiatric/Behavioral:         Please refer to HPI   Medications: I have reviewed the patient's current medications.  Current Outpatient Medications  Medication Sig Dispense Refill   amphetamine-dextroamphetamine (ADDERALL) 10 MG tablet Take 1/2-1 tab po BID 60 tablet 0   cholecalciferol (VITAMIN D3) 25 MCG (1000 UT) tablet Take 1,000 Units by mouth daily.     FLUoxetine (PROZAC) 20 MG capsule TAKE 1 CAPSULE(20 MG) BY MOUTH DAILY 90 capsule 1   ibuprofen (ADVIL) 200 MG tablet Take 200 mg  by mouth every 6 (six) hours as needed.     lithium carbonate 300 MG capsule TAKE 1 CAPSULE(300 MG) BY MOUTH AT BEDTIME 90 capsule 1   Multiple Vitamins-Minerals (MULTIVITAMIN WOMEN PO) Take by mouth.     norethindrone-ethinyl estradiol (LOESTRIN) 1-20 MG-MCG tablet TK 1 T PO QD     ALPRAZolam (XANAX) 0.5 MG tablet Take 1/2-1 tab po qd prn panic 5 tablet 2   amphetamine-dextroamphetamine (ADDERALL) 10 MG tablet Take 1/2-1 tab po BID 60 tablet 0   amphetamine-dextroamphetamine (ADDERALL) 10 MG tablet Take 1/2-1 tab po BID 60 tablet 0   No current facility-administered medications for this visit.    Medication Side Effects: None  Allergies: No Known Allergies  Past Medical History:  Diagnosis Date   Depression     Past Medical History, Surgical history, Social history, and Family history were reviewed and updated as appropriate.   Please see review of systems for further details on the patient's review from today.   Objective:   Physical Exam:  BP 129/81   Pulse 80   Physical Exam Constitutional:      General: She is not in acute distress. Musculoskeletal:        General: No deformity.  Neurological:     Mental Status: She is alert and oriented to person, place, and time.     Coordination: Coordination normal.  Psychiatric:        Attention and Perception: Attention and perception normal. She does not perceive auditory or visual hallucinations.  Mood and Affect: Mood normal. Mood is not anxious or depressed. Affect is not labile, blunt, angry or inappropriate.        Speech: Speech normal.        Behavior: Behavior normal.        Thought Content: Thought content normal. Thought content is not paranoid or delusional. Thought content does not include homicidal or suicidal ideation. Thought content does not include homicidal or suicidal plan.        Cognition and Memory: Cognition and memory normal.        Judgment: Judgment normal.     Comments: Insight intact     Lab Review:  No results found for: NA, K, CL, CO2, GLUCOSE, BUN, CREATININE, CALCIUM, PROT, ALBUMIN, AST, ALT, ALKPHOS, BILITOT, GFRNONAA, GFRAA     Component Value Date/Time   WBC 10.1 08/22/2014 1405   RBC 5.42 08/22/2014 1405   HGB 14.8 08/22/2014 1405   HCT 45.2 08/22/2014 1405   MCV 83.4 08/22/2014 1405   MCH 27.3 08/22/2014 1405   MCHC 32.7 08/22/2014 1405    No results found for: POCLITH, LITHIUM   No results found for: PHENYTOIN, PHENOBARB, VALPROATE, CBMZ   .res Assessment: Plan:   Will continue current plan of care since target signs and symptoms are well controlled without any tolerability issues. Will discontinue Deplin due to no improvement in depressive s/s.  Continue Adderall 10 mg 1/2-1 tab po BID for ADHD. She reports that she does not need any additional scripts at this time.  Continue Alprazolam 0.5 mg 1/2-1 tab po qd prn panic.  Continue Prozac 20 mg po qd for anxiety and depression.  Continue Lithium 300 mg at bedtime for mood and prevention of suicidal thoughts.  Recommend continuing therapy with Stevphen Meuse, [facility]. Pt to follow-up with this provider in 3 months or sooner if clinically indicated.  Patient advised to contact office with any questions, adverse effects, or acute worsening in signs and symptoms.   Jennifer Clements was seen today for follow-up.  Diagnoses and all orders for this visit:  Generalized anxiety disorder -     ALPRAZolam (XANAX) 0.5 MG tablet; Take 1/2-1 tab po qd prn panic  Mild episode of recurrent major depressive disorder (HCC)    Please see After Visit Summary for patient specific instructions.  Future Appointments  Date Time Provider Department Center  06/09/2021  2:00 PM Stevphen Meuse, [facility] CP-[facility] None  07/07/2021  1:00 PM Stevphen Meuse, [facility] CP-CP None  08/18/2021 10:30 AM Corie Chiquito, [facility] CP-CP None    No orders of the defined types were placed in this encounter.   -------------------------------

## 2021-06-09 ENCOUNTER — Ambulatory Visit (INDEPENDENT_AMBULATORY_CARE_PROVIDER_SITE_OTHER): Payer: BC Managed Care – PPO | Admitting: Psychiatry

## 2021-06-09 ENCOUNTER — Other Ambulatory Visit: Payer: Self-pay

## 2021-06-09 DIAGNOSIS — F331 Major depressive disorder, recurrent, moderate: Secondary | ICD-10-CM

## 2021-06-09 NOTE — Progress Notes (Signed)
[facility] Counselor/Therapist Progress Note  Patient ID: Jennifer Clements, MRN: [id number],    Date: 06/09/2021  Time Spent: 64 minutes start time 2:06 PM end time 3:10 PM  Treatment Type: Individual Therapy  Reported Symptoms: anxiety, sadness, irritability, spending issues, body image issues, intrusive thoughts, triggered responses, crying spells  Mental Status Exam:  Appearance:   Well Groomed     Behavior:  Appropriate  Motor:  Normal  Speech/Language:   Normal Rate  Affect:  Appropriate tearful  Mood:  anxious and sad  Thought process:  normal  Thought content:    WNL  Sensory/Perceptual disturbances:    WNL  Orientation:  oriented to person, place, time/date, and situation  Attention:  Good  Concentration:  Good  Memory:  WNL  Fund of knowledge:   Good  Insight:    Good  Judgment:   Good  Impulse Control:  Good   Risk Assessment: Danger to Self:  patient admitted to having +SI at times but could contract for safety Self-injurious Behavior: No Danger to Others: No Duty to Warn:no Physical Aggression / Violence:No  Access to Firearms a concern: No  Gang Involvement:No   Subjective: Patient was present for session.  She shared that it has been a hard month.  She went on to share that when her sadness starts her spending goes up.  She shared that she has realized that it is easy to spend her since she grew up in a situation where things were not as easy to afford.  She explained she felt lots of pressure growing up to play a certain role.  She explained that she was torn between different groups and the 2 different countries that she resided in as a child.  Patient went on to share that that has led to some significant body image issues and she feels like she looks like a monster because she is a larger weight.  Patient stated she was small until she started going on birth control pills for her PCOS and she has gained weight since then.  Discussed the importance of her  being comfortable with her weight due to medications making it difficult for her to lose weight.  Patient did EMDR set on hiding behind couch, suds level 10, negative cognition "I cannot look perfect" felt shame and sadness in her stomach and throat.  Patient was able to reduce suds level to 5.  She shared that it is very hard for her to see herself being able to accept her weight issues.  Patient was encouraged to continue to try and remind herself that she is enough and she does not have to be perfect.  She was encouraged to try to find small ways through the day to work in some movement to release the stress from her body as well as help burn calories.  Patient was encouraged to contact our office if she needs to return for an earlier appointment.  She had reported due to co-pays she is unable to come in more than once a month at this time.  Interventions: Cognitive Behavioral Therapy, Solution-Oriented/Positive Psychology, Eye Movement Desensitization and Reprocessing (EMDR), and Insight-Oriented  Diagnosis:   ICD-10-CM   1. Major depressive disorder, recurrent episode, moderate (HCC)  F33.1       Plan: Patient is to use CBT and coping skills to decrease depression symptoms.  Patient is to follow safety plan for intrusive thoughts.  Patient is to work on finding ways to move throughout the  day to release negative emotions appropriately.  Patient is to take medication as directed Long-term goal: Develop healthy cognitive patterns and believes about self in the world that lead to alleviation and help prevent the relapse of depression symptoms Short-term goal: Implement regular exercise regimen as a depressant reduction technique.  Identify and replace depressive thinking that leads to depressive feelings and actions.    Stevphen Meuse, [facility]

## 2021-07-07 ENCOUNTER — Other Ambulatory Visit: Payer: Self-pay

## 2021-07-07 ENCOUNTER — Ambulatory Visit (INDEPENDENT_AMBULATORY_CARE_PROVIDER_SITE_OTHER): Payer: BC Managed Care – PPO | Admitting: Psychiatry

## 2021-07-07 DIAGNOSIS — Z23 Encounter for immunization: Secondary | ICD-10-CM | POA: Diagnosis not present

## 2021-07-07 DIAGNOSIS — F331 Major depressive disorder, recurrent, moderate: Secondary | ICD-10-CM

## 2021-07-07 NOTE — Progress Notes (Signed)
[facility] Counselor/Therapist Progress Note  Patient ID: Jennifer Clements, MRN: [id number],    Date: 07/07/2021  Time Spent: 56 minutes start time 1:03 PM end time 1:59 PM  Treatment Type: Individual Therapy  Reported Symptoms: anxiety, sadness, triggered responses,panic, crying spell, breakdown, rumination  Mental Status Exam:  Appearance:   Well Groomed     Behavior:  Appropriate  Motor:  Normal  Speech/Language:   Normal Rate  Affect:  Appropriate tearful difficulty breathing  Mood:  anxious and sad  Thought process:  normal  Thought content:    WNL  Sensory/Perceptual disturbances:    WNL  Orientation:  oriented to person, place, time/date, and situation  Attention:  Good  Concentration:  Good  Memory:  WNL  Fund of knowledge:   Good  Insight:    Good  Judgment:   Good  Impulse Control:  Good   Risk Assessment: Danger to Self:  No none current but she did have passive thoughts when she had her breakdown a few weeks ago.  Patient contracted for safety at session Self-injurious Behavior: No Danger to Others: No Duty to Warn:no Physical Aggression / Violence:No  Access to Firearms a concern: No  Gang Involvement:No   Subjective: Patient was present for session.  She shared she was able to talk with her boss about her situation at work which was progress for her.  She explained it made her feel good about herself because she was able to say what was on her mind. She had a confrontation with her sister as well the beginning of August. She shared that it led to patient have to leave work and she had a break down in her car for a few hours.  Patient went on to share she was unable to go to work the next day due to the depression that was triggered.  Patient stated she was able to get herself back to work but she was still having lots of struggles with the whole situation.  Patient was allowed time to process what happened with her and her sister as well as discussed the  family dynamic that causes great anxiety and sadness for her.  Patient was encouraged to recognize that the wall between her sister and mother is not her while and if she has to continue patching it to make it okay for her to look at that is okay because she cannot take it down only they can do that.  Patient reported that visualization was helpful for her and that she is working hard at keeping perspective and recognizing what is hers and what is other people's.  Patient stated she is going to continue trying to keep healthy boundaries and work on limit setting so that she does not take on things that she cannot fix and change her control.  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  Patient was encouraged to focus on taking care of herself and releasing emotions appropriately.  Interventions: Cognitive Behavioral Therapy, Solution-Oriented/Positive Psychology, and Insight-Oriented  Diagnosis:   ICD-10-CM   1. Major depressive disorder, recurrent episode, moderate (HCC)  F33.1       Plan: Patient is to use CBT and coping skills to decrease depression symptoms.  Patient is to continue focusing on the things that she can control fix and change.  Patient is to continue setting healthy boundaries and limits with family members  and work situations.  Is to release negative emotions through movement.  Patient is to take medication as directed. Long-term goal: Develop healthy cognitive patterns and believes about self in the world that lead to alleviation and help prevent the relapse of depression symptoms Short-term goal: Implement regular exercise regimen as a depressant reduction technique.  Identify and replace depressive thinking that leads to depressive feelings and actions  Stevphen Meuse, [facility]

## 2021-08-11 ENCOUNTER — Ambulatory Visit: Payer: BC Managed Care – PPO | Admitting: Psychiatry

## 2021-08-18 ENCOUNTER — Encounter: Payer: Self-pay | Admitting: Psychiatry

## 2021-08-18 ENCOUNTER — Other Ambulatory Visit: Payer: Self-pay

## 2021-08-18 ENCOUNTER — Ambulatory Visit (INDEPENDENT_AMBULATORY_CARE_PROVIDER_SITE_OTHER): Payer: BC Managed Care – PPO | Admitting: Psychiatry

## 2021-08-18 DIAGNOSIS — F330 Major depressive disorder, recurrent, mild: Secondary | ICD-10-CM | POA: Diagnosis not present

## 2021-08-18 DIAGNOSIS — F900 Attention-deficit hyperactivity disorder, predominantly inattentive type: Secondary | ICD-10-CM | POA: Diagnosis not present

## 2021-08-18 DIAGNOSIS — F411 Generalized anxiety disorder: Secondary | ICD-10-CM | POA: Diagnosis not present

## 2021-08-18 MED ORDER — FLUOXETINE HCL 20 MG PO CAPS: ORAL_CAPSULE | ORAL | 1 refills | Status: DC

## 2021-08-18 MED ORDER — LITHIUM CARBONATE 300 MG PO CAPS: ORAL_CAPSULE | ORAL | 1 refills | Status: DC

## 2021-08-18 MED ORDER — AMPHETAMINE-DEXTROAMPHETAMINE 10 MG PO TABS: ORAL_TABLET | ORAL | 0 refills | Status: DC

## 2021-08-18 MED ORDER — AMPHETAMINE-DEXTROAMPHETAMINE 10 MG PO TABS
ORAL_TABLET | ORAL | 0 refills | Status: DC
Start: 2021-09-15 — End: 2021-12-15
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
## 2021-09-01 ENCOUNTER — Other Ambulatory Visit: Payer: Self-pay

## 2021-09-01 ENCOUNTER — Ambulatory Visit (INDEPENDENT_AMBULATORY_CARE_PROVIDER_SITE_OTHER): Payer: BC Managed Care – PPO | Admitting: Psychiatry

## 2021-09-01 DIAGNOSIS — F331 Major depressive disorder, recurrent, moderate: Secondary | ICD-10-CM | POA: Diagnosis not present

## 2021-09-01 NOTE — Progress Notes (Signed)
      [facility] Counselor/Therapist Progress Note  Patient ID: Jennifer Clements, MRN: [id number],    Date: 09/01/2021  Time Spent: 50 minutes start time 1:10 PM end time 2 PM  Treatment Type: Individual Therapy  Reported Symptoms: depression,anxiety, panic, grief issues, rumination  Mental Status Exam:  Appearance:   Well Groomed     Behavior:  Appropriate  Motor:  Normal  Speech/Language:   Normal Rate  Affect:  Appropriate tearful  Mood:  sad  Thought process:  normal  Thought content:    WNL  Sensory/Perceptual disturbances:    WNL  Orientation:  oriented to person, place, time/date, and situation  Attention:  Good  Concentration:  Good  Memory:  WNL  Fund of knowledge:   Good  Insight:    Good  Judgment:   Good  Impulse Control:  Good   Risk Assessment: Danger to Self:  No Self-injurious Behavior: No Danger to Others: No Duty to Warn:no Physical Aggression / Violence:No  Access to Firearms a concern: No  Gang Involvement:No   Subjective: Patient was present for session.  She shared that her sister's boyfriend who she had dated for 6 years passed away suddenly and it was really hard on everyone. She went on to share there was lots of drama surrounding the whole situation which increased her anxiety.  She patient explained the thing that was disturbing her the most is having dreams about cats getting out and being responsible for them did EMDR processing on that visual, suds level 9, negative cognition "if I am not there something bad is going to happen" felt anxiety in her throat chest and shoulders.  Patient was able to reduce suds level to 5.  She was able to recognize that her issue is she feels responsible for everybody especially her mother and sister and if they do not contact her she starts having obsessive thinking.  Patient was encouraged to start reminding herself regularly of all the times when things have worked out for them so that when she starts worrying she  can remind herself of those triggers.  Interventions: Cognitive Behavioral Therapy, Solution-Oriented/Positive Psychology, Eye Movement Desensitization and Reprocessing (EMDR), and Insight-Oriented  Diagnosis:   ICD-10-CM   1. Major depressive disorder, recurrent episode, moderate (HCC)  F33.1       Plan: Patient is to use CBT and coping skills to decrease depression symptoms.  Patient is to remind herself of the things that have worked out fine for her sister and mother.  Patient is to work on self talk.  Patient is to take medication as directed. Long-term goal: Develop healthy cognitive patterns and believes about self in the world that lead to alleviation and help prevent the relapse of depression symptoms Short-term goal: Implement regular exercise regimen as a depressant reduction technique.  Identify and replace depressive thinking that leads to depressive feelings and actions    Stevphen Meuse, [facility]

## 2021-09-29 DIAGNOSIS — H5213 Myopia, bilateral: Secondary | ICD-10-CM | POA: Diagnosis not present

## 2021-09-29 DIAGNOSIS — H43393 Other vitreous opacities, bilateral: Secondary | ICD-10-CM | POA: Diagnosis not present

## 2021-10-06 DIAGNOSIS — Z0000 Encounter for general adult medical examination without abnormal findings: Secondary | ICD-10-CM | POA: Diagnosis not present

## 2021-10-20 ENCOUNTER — Other Ambulatory Visit: Payer: Self-pay

## 2021-10-20 ENCOUNTER — Ambulatory Visit (INDEPENDENT_AMBULATORY_CARE_PROVIDER_SITE_OTHER): Payer: BC Managed Care – PPO | Admitting: Psychiatry

## 2021-10-20 DIAGNOSIS — F330 Major depressive disorder, recurrent, mild: Secondary | ICD-10-CM | POA: Diagnosis not present

## 2021-10-20 NOTE — Progress Notes (Signed)
      [facility] Counselor/Therapist Progress Note  Patient ID: Jennifer Clements, MRN: [id number],    Date: 10/20/2021  Time Spent: 54 minutes start time 3:10 PM end time 4:04 PM  Treatment Type: Individual Therapy  Reported Symptoms: anxiety, headaches, sleep issues, irritability, rumination, depression  Mental Status Exam:  Appearance:   Well Groomed     Behavior:  Appropriate  Motor:  Normal  Speech/Language:   Normal Rate  Affect:  Appropriate  Mood:  anxious  Thought process:  normal  Thought content:    WNL  Sensory/Perceptual disturbances:    WNL  Orientation:  oriented to person, place, time/date, and situation  Attention:  Good  Concentration:  Good  Memory:  WNL  Fund of knowledge:   Good  Insight:    Good  Judgment:   Good  Impulse Control:  Good   Risk Assessment: Danger to Self:  No Self-injurious Behavior: No Danger to Others: No Duty to Warn:no Physical Aggression / Violence:No  Access to Firearms a concern: No  Gang Involvement:No   Subjective: Patient was present for session.  She shared that she has been very stressed and overwhelmed at work lately.  She stated that she has had lots of self doubt.  Patient reported she is having an upcoming trip with her friend group and she is having lots of anxiety.  Patient did EMDR set on her [employer] group, suds level 10, negative cognition "I am not wanted" felt anxiety in her neck and head.  Patient was able to reduce suds level to 5.  She was able to recognize the importance of working on her self talk and taking care of herself even on the trip.  Patient was able to identify when her anxiety concerning being liked started at age 89 and 58 and work with that part of her.  Interventions: Solution-Oriented/Positive Psychology, Eye Movement Desensitization and Reprocessing (EMDR), and Insight-Oriented  Diagnosis:   ICD-10-CM   1. Mild episode of recurrent major depressive disorder (HCC)  F33.0       Plan:  Patient is to use CBT and coping skills to decrease depression symptoms.  Patient is to follow plans from session to take care of herself during her weekend away with friends.  Patient is to work on finding a new job.  Patient is to take medication as directed Long-term goal: Develop healthy cognitive patterns and believes about self in the world that lead to alleviation and help prevent the relapse of depression symptoms Short-term goal: Implement regular exercise regimen as a depressant reduction technique.  Identify and replace depressive thinking that leads to depressive feelings and actions  Stevphen Meuse, [facility]

## 2021-11-24 ENCOUNTER — Ambulatory Visit: Payer: BC Managed Care – PPO | Admitting: Psychiatry

## 2021-12-02 ENCOUNTER — Other Ambulatory Visit: Payer: Self-pay

## 2021-12-02 DIAGNOSIS — F411 Generalized anxiety disorder: Secondary | ICD-10-CM

## 2021-12-02 MED ORDER — ALPRAZOLAM 0.5 MG PO TABS: ORAL_TABLET | ORAL | 2 refills | Status: DC

## 2021-12-15 ENCOUNTER — Other Ambulatory Visit: Payer: Self-pay

## 2021-12-15 ENCOUNTER — Ambulatory Visit (INDEPENDENT_AMBULATORY_CARE_PROVIDER_SITE_OTHER): Payer: BC Managed Care – PPO | Admitting: Psychiatry

## 2021-12-15 ENCOUNTER — Encounter: Payer: Self-pay | Admitting: Psychiatry

## 2021-12-15 DIAGNOSIS — F411 Generalized anxiety disorder: Secondary | ICD-10-CM

## 2021-12-15 DIAGNOSIS — F330 Major depressive disorder, recurrent, mild: Secondary | ICD-10-CM

## 2021-12-15 DIAGNOSIS — F900 Attention-deficit hyperactivity disorder, predominantly inattentive type: Secondary | ICD-10-CM | POA: Diagnosis not present

## 2021-12-15 MED ORDER — FLUOXETINE HCL 20 MG PO CAPS: ORAL_CAPSULE | ORAL | 1 refills | Status: DC

## 2021-12-15 MED ORDER — AMPHETAMINE-DEXTROAMPHETAMINE 10 MG PO TABS: ORAL_TABLET | ORAL | 0 refills | Status: DC

## 2021-12-15 MED ORDER — LITHIUM CARBONATE 300 MG PO CAPS: ORAL_CAPSULE | ORAL | 1 refills | Status: DC

## 2021-12-15 NOTE — Progress Notes (Signed)
Jennifer Clements [id number] Sep 15, 1986 36 y.o.  Subjective:   Patient ID:  Jennifer Clements is a 36 y.o. (DOB Sep 15, 1986) female.  Chief Complaint:  Chief Complaint  Patient presents with   Follow-up    Depression, anxiety, insomnia, ADHD    HPI Jennifer Clements presents to the office today for follow-up of anxiety, depression, ADHD, and insomnia. She reports that her anxiety has been manageable. Has been using some affirmations. She reports that one major stressor resolved. Denies panic. She reports that she has had some irritation recently. She has noticed increased mood changes (lower mood) around her menstrual cycle. She reports that she missed work in early January due to low energy and other pre-menstrual s/s. She reports that her mood typically improves after the start of menses. She reports that her mood has been "normal."  She reports that her energy is "pretty normal" aside from time before her period. She reports that she has been having increased difficulty getting to sleep and denies anxious thoughts at night. Sleep qty ranges from 6-8 hours. Appetite has been "normal." She notices increased difficulty with concentration when she experiences mood changes. Denies anhedonia. Denies SI.    Past Psychiatric Medication Trials: Prozac- Effective for anxiety. Took low dose, possibly 10 mg. Does not recall side effects. Zoloft- Effective and then no longer seemed to be as effective. Had discontinuation s/s. Stopped in 2015   Adderall IR- Effective. Does not recall side effects. Typically felt more calm. May have taken 10 mg 1/2-1 tab BID Trazodone- Ineffective Silenor Dayvigo- Excessive daytime somnolence Xanax- helpful for insomnia.  Deplin- no improvement.     Review of Systems:  Review of Systems  Cardiovascular:  Negative for palpitations.  Musculoskeletal:  Negative for gait problem.  Neurological:  Negative for tremors.  Psychiatric/Behavioral:         Please refer to HPI    Medications: I have reviewed the patient's current medications.  Current Outpatient Medications  Medication Sig Dispense Refill   ALPRAZolam (XANAX) 0.5 MG tablet Take 1/2-1 tab po qd prn panic 5 tablet 2   cholecalciferol (VITAMIN D3) 25 MCG (1000 UT) tablet Take 1,000 Units by mouth daily.     ibuprofen (ADVIL) 200 MG tablet Take 200 mg by mouth every 6 (six) hours as needed.     Multiple Vitamins-Minerals (MULTIVITAMIN WOMEN PO) Take by mouth.     norethindrone-ethinyl estradiol (LOESTRIN) 1-20 MG-MCG tablet TK 1 T PO QD     [START ON 02/09/2022] amphetamine-dextroamphetamine (ADDERALL) 10 MG tablet Take 1/2-1 tab po BID 60 tablet 0   [START ON 01/12/2022] amphetamine-dextroamphetamine (ADDERALL) 10 MG tablet Take 1/2-1 tab po BID 60 tablet 0   amphetamine-dextroamphetamine (ADDERALL) 10 MG tablet Take 1/2-1 tab po BID 60 tablet 0   FLUoxetine (PROZAC) 20 MG capsule TAKE 1 CAPSULE(20 MG) BY MOUTH DAILY 90 capsule 1   lithium carbonate 300 MG capsule TAKE 1 CAPSULE(300 MG) BY MOUTH AT BEDTIME 90 capsule 1   No current facility-administered medications for this visit.    Medication Side Effects: None  Allergies: No Known Allergies  Past Medical History:  Diagnosis Date   Depression     Past Medical History, Surgical history, Social history, and Family history were reviewed and updated as appropriate.   Please see review of systems for further details on the patient's review from today.   Objective:   Physical Exam:  BP 121/79    Pulse 76   Physical Exam Constitutional:      General:  She is not in acute distress. Musculoskeletal:        General: No deformity.  Neurological:     Mental Status: She is alert and oriented to person, place, and time.     Coordination: Coordination normal.  Psychiatric:        Attention and Perception: Attention and perception normal. She does not perceive auditory or visual hallucinations.        Mood and Affect: Mood normal. Mood is not  anxious or depressed. Affect is not labile, blunt, angry or inappropriate.        Speech: Speech normal.        Behavior: Behavior normal.        Thought Content: Thought content normal. Thought content is not paranoid or delusional. Thought content does not include homicidal or suicidal ideation. Thought content does not include homicidal or suicidal plan.        Cognition and Memory: Cognition and memory normal.        Judgment: Judgment normal.     Comments: Insight intact    Lab Review:  No results found for: NA, K, CL, CO2, GLUCOSE, BUN, CREATININE, CALCIUM, PROT, ALBUMIN, AST, ALT, ALKPHOS, BILITOT, GFRNONAA, GFRAA     Component Value Date/Time   WBC 10.1 08/22/2014 1405   RBC 5.42 08/22/2014 1405   HGB 14.8 08/22/2014 1405   HCT 45.2 08/22/2014 1405   MCV 83.4 08/22/2014 1405   MCH 27.3 08/22/2014 1405   MCHC 32.7 08/22/2014 1405    No results found for: POCLITH, LITHIUM   No results found for: PHENYTOIN, PHENOBARB, VALPROATE, CBMZ   .res Assessment: Plan:    Discussed option of increasing prozac dose or increasing dose by 50% about 10 days-2 weeks before the onset of menses and then resuming 20 mg dose. She reports that she prefers to first talk with her obgyn since she has noticed mood changes since her OCP packaging changed several months ago and questions if it may be due to a change in her OCP. Will continue Prozac 20 mg po qd for anxiety and depression.  Continue Adderall 10 mg 1/2-1 tab po BID for ADHD.  Continue Alprazolam 0.5 mg 1/2-1 tab po qd prn panic. Recommend continuing therapy with Stevphen Meuse, [facility].  Pt to follow-up in 3 months or sooner if clinically indicated.  Patient advised to contact office with any questions, adverse effects, or acute worsening in signs and symptoms.   Jennifer Clements was seen today for follow-up.  Diagnoses and all orders for this visit:  Attention deficit hyperactivity disorder (ADHD), predominantly inattentive type -      amphetamine-dextroamphetamine (ADDERALL) 10 MG tablet; Take 1/2-1 tab po BID -     amphetamine-dextroamphetamine (ADDERALL) 10 MG tablet; Take 1/2-1 tab po BID -     amphetamine-dextroamphetamine (ADDERALL) 10 MG tablet; Take 1/2-1 tab po BID  Mild episode of recurrent major depressive disorder (HCC) -     FLUoxetine (PROZAC) 20 MG capsule; TAKE 1 CAPSULE(20 MG) BY MOUTH DAILY -     lithium carbonate 300 MG capsule; TAKE 1 CAPSULE(300 MG) BY MOUTH AT BEDTIME  Generalized anxiety disorder -     FLUoxetine (PROZAC) 20 MG capsule; TAKE 1 CAPSULE(20 MG) BY MOUTH DAILY     Please see After Visit Summary for patient specific instructions.  Future Appointments  Date Time Provider Department Center  12/29/2021 12:00 PM Stevphen Meuse, [facility] None  03/09/2022 10:30 AM Corie Chiquito, [facility]NP CP-[facility] None    No orders of the  defined types were placed in this encounter.   -------------------------------

## 2021-12-24 ENCOUNTER — Other Ambulatory Visit: Payer: Self-pay | Admitting: Psychiatry

## 2021-12-24 MED ORDER — AMPHETAMINE-DEXTROAMPHETAMINE 20 MG PO TABS: ORAL_TABLET | ORAL | 0 refills | Status: DC

## 2021-12-24 NOTE — Telephone Encounter (Signed)
Pt wants to know if Janett Billow will refill her Adderall 10mg  with Adderall 20mg , which is available at   [facility], [address]  [place], [place] [address]  Phone:  [phone]  Fax:  [phone]   Next appt 4/24

## 2021-12-29 ENCOUNTER — Other Ambulatory Visit: Payer: Self-pay

## 2021-12-29 ENCOUNTER — Ambulatory Visit (INDEPENDENT_AMBULATORY_CARE_PROVIDER_SITE_OTHER): Payer: BC Managed Care – PPO | Admitting: Psychiatry

## 2021-12-29 DIAGNOSIS — F330 Major depressive disorder, recurrent, mild: Secondary | ICD-10-CM

## 2021-12-29 NOTE — Progress Notes (Signed)
°      [facility] Counselor/Therapist Progress Note  Patient ID: Jennifer Clements, MRN: [id number],    Date: 12/29/2021  Time Spent: 50 minutes start time 12:04 PM end time 12:54 PM  Treatment Type: Individual Therapy  Reported Symptoms: anxiety, sadness, triggered responses, grief issues,crying spells  Mental Status Exam:  Appearance:   Well Groomed     Behavior:  Appropriate  Motor:  Normal  Speech/Language:   Normal Rate  Affect:  Appropriate tearful  Mood:  anxious  Thought process:  normal  Thought content:    WNL  Sensory/Perceptual disturbances:    WNL  Orientation:  oriented to person, place, time/date, and situation  Attention:  Good  Concentration:  Good  Memory:  WNL  Fund of knowledge:   Good  Insight:    Good  Judgment:   Good  Impulse Control:  Good   Risk Assessment: Danger to Self:  No Self-injurious Behavior: No Danger to Others: No Duty to Warn:no Physical Aggression / Violence:No  Access to Firearms a concern: No  Gang Involvement:No   Subjective: Patient was present for session.  She shared she is ready to move forward with her career.  She went on to share that due to finances she will need to put treatment on hold.  Discussed how patient has made progress and was doing well overall currently.  She shared that she had been able to follow through on plans from last session and things went well with her new group. Her cousin was in a bad accident and that has been hard for her. She has recognized it is hard for her when she can't help others but she is realizing she has to focus on what she can control fix and change and allow others to make their choices.Discussed CBT skill she can use to help her continue making positive choices.   Interventions: Cognitive Behavioral Therapy and Solution-Oriented/Positive Psychology  Diagnosis:   ICD-10-CM   1. Mild episode of recurrent major depressive disorder (HCC)  F33.0       Plan: Patient is to use CBT and  coping skills to decrease depression symptoms.  Patient is to follow plans from session to focus on what she can control fix and change and allow others to deal with consequences of their choices.  Patient is to work on finding a new job.  Patient is to take medication as directed Long-term goal: Develop healthy cognitive patterns and believes about self in the world that lead to alleviation and help prevent the relapse of depression symptoms Short-term goal: Implement regular exercise regimen as a depressant reduction technique.  Identify and replace depressive thinking that leads to depressive feelings and actions    Stevphen Meuse, [facility]

## 2022-01-05 DIAGNOSIS — Z01419 Encounter for gynecological examination (general) (routine) without abnormal findings: Secondary | ICD-10-CM | POA: Diagnosis not present

## 2022-01-05 DIAGNOSIS — Z1329 Encounter for screening for other suspected endocrine disorder: Secondary | ICD-10-CM | POA: Diagnosis not present

## 2022-03-09 ENCOUNTER — Encounter: Payer: Self-pay | Admitting: Psychiatry

## 2022-03-09 ENCOUNTER — Ambulatory Visit (INDEPENDENT_AMBULATORY_CARE_PROVIDER_SITE_OTHER): Payer: BC Managed Care – PPO | Admitting: Psychiatry

## 2022-03-09 DIAGNOSIS — F900 Attention-deficit hyperactivity disorder, predominantly inattentive type: Secondary | ICD-10-CM | POA: Diagnosis not present

## 2022-03-09 DIAGNOSIS — F330 Major depressive disorder, recurrent, mild: Secondary | ICD-10-CM

## 2022-03-09 DIAGNOSIS — F411 Generalized anxiety disorder: Secondary | ICD-10-CM | POA: Diagnosis not present

## 2022-03-09 MED ORDER — FLUOXETINE HCL 20 MG PO CAPS: ORAL_CAPSULE | ORAL | 1 refills | Status: DC

## 2022-03-09 MED ORDER — AMPHETAMINE-DEXTROAMPHETAMINE 20 MG PO TABS: ORAL_TABLET | ORAL | 0 refills | Status: DC

## 2022-03-09 MED ORDER — AMPHETAMINE-DEXTROAMPHETAMINE 20 MG PO TABS: 10.0000 mg | ORAL_TABLET | Freq: Two times a day (BID) | ORAL | 0 refills | Status: DC

## 2022-03-09 MED ORDER — LITHIUM CARBONATE 300 MG PO CAPS: ORAL_CAPSULE | ORAL | 1 refills | Status: DC

## 2022-03-09 NOTE — Progress Notes (Signed)
Isatu Macinnes ?[id number] ?[birth year] ?36 y.o. ? ?Subjective:  ? ?Patient ID:  Jennifer Clements is a 36 y.o. (DOB June 16, 1986) female. ? ?Chief Complaint:  ?Chief Complaint  ?Patient presents with  ? Follow-up  ?  Anxiety, depression, ADHD, and insomnia  ? ? ?HPI ?Jennifer Clements presents to the office today for follow-up of anxiety, depression, ADHD, and insomnia.  ?She reports that she has been doing well overall. She reports that she has had some mild anxiety that is mostly work related and has been manageable. She reports that anxiety outside of work has improved. Reports that work -related anxiety has not been paralyzing. She reports that she has not had any severe depressive episode. Sleep has improved some and usually is getting adequate sleep. Energy and motivation have been good overall. Concentration has been "sporadic, but manageable." Appetite as been ok with some fluctuations. She reports that she has some days where she forgets to eat when busy at work. Denies SI.  ? ?She reports that she joined a gym and this seemed to help with premenstrual dysphoric symptoms and sleep.  ? ?She reports that she has been taking Xanax prn less overall and uses it sparingly. Typically taking Adderall 10 mg twice daily.  ? ?Past Psychiatric Medication Trials: ?Prozac- Effective for anxiety. Took low dose, possibly 10 mg. Does not recall side effects. ?Zoloft- Effective and then no longer seemed to be as effective. Had discontinuation s/s. Stopped in 2015   ?Adderall IR- Effective. Does not recall side effects. Typically felt more calm. May have taken 10 mg 1/2-1 tab BID ?Trazodone- Ineffective ?Silenor ?Dayvigo- Excessive daytime somnolence ?Xanax- helpful for insomnia.  ?Deplin- no improvement. ?  ? ?Review of Systems:  ?Review of Systems  ?Cardiovascular:  Negative for palpitations.  ?Musculoskeletal:  Negative for gait problem.  ?Neurological:  Negative for tremors.  ?Psychiatric/Behavioral:    ?     Please refer to HPI   ? ?Medications: I have reviewed the patient's current medications. ? ?Current Outpatient Medications  ?Medication Sig Dispense Refill  ? ALPRAZolam (XANAX) 0.5 MG tablet Take 1/2-1 tab po qd prn panic 5 tablet 2  ? [START ON 04/06/2022] amphetamine-dextroamphetamine (ADDERALL) 20 MG tablet Take 0.5 tablets (10 mg total) by mouth 2 (two) times daily. 30 tablet 0  ? [START ON 05/04/2022] amphetamine-dextroamphetamine (ADDERALL) 20 MG tablet Take 0.5 tablets (10 mg total) by mouth 2 (two) times daily. 30 tablet 0  ? cholecalciferol (VITAMIN D3) 25 MCG (1000 UT) tablet Take 1,000 Units by mouth daily.    ? ibuprofen (ADVIL) 200 MG tablet Take 200 mg by mouth every 6 (six) hours as needed.    ? Multiple Vitamins-Minerals (MULTIVITAMIN WOMEN PO) Take by mouth.    ? norethindrone-ethinyl estradiol (LOESTRIN) 1-20 MG-MCG tablet TK 1 T PO QD    ? amphetamine-dextroamphetamine (ADDERALL) 20 MG tablet Take 1/2 tab BID. 30 tablet 0  ? FLUoxetine (PROZAC) 20 MG capsule TAKE 1 CAPSULE(20 MG) BY MOUTH DAILY 90 capsule 1  ? lithium carbonate 300 MG capsule TAKE 1 CAPSULE(300 MG) BY MOUTH AT BEDTIME 90 capsule 1  ? ?No current facility-administered medications for this visit.  ? ? ?Medication Side Effects: None ? ?Allergies: No Known Allergies ? ?Past Medical History:  ?Diagnosis Date  ? Depression   ? ? ?Past Medical History, Surgical history, Social history, and Family history were reviewed and updated as appropriate.  ? ?Please see review of systems for further details on the patient's review from today.  ? ?Objective:  ? ?  Physical Exam:  ?There were no vitals taken for this visit. ? ?Physical Exam ?Constitutional:   ?   General: She is not in acute distress. ?Musculoskeletal:     ?   General: No deformity.  ?Neurological:  ?   Mental Status: She is alert and oriented to person, place, and time.  ?   Coordination: Coordination normal.  ?Psychiatric:     ?   Attention and Perception: Attention and perception normal. She does not  perceive auditory or visual hallucinations.     ?   Mood and Affect: Mood normal. Mood is not anxious or depressed. Affect is not labile, blunt, angry or inappropriate.     ?   Speech: Speech normal.     ?   Behavior: Behavior normal.     ?   Thought Content: Thought content normal. Thought content is not paranoid or delusional. Thought content does not include homicidal or suicidal ideation. Thought content does not include homicidal or suicidal plan.     ?   Cognition and Memory: Cognition and memory normal.     ?   Judgment: Judgment normal.  ?   Comments: Insight intact  ? ? ?Lab Review:  ?No results found for: NA, K, CL, CO2, GLUCOSE, BUN, CREATININE, CALCIUM, PROT, ALBUMIN, AST, ALT, ALKPHOS, BILITOT, GFRNONAA, GFRAA ? ?   ?Component Value Date/Time  ? WBC 10.1 08/22/2014 1405  ? RBC 5.42 08/22/2014 1405  ? HGB 14.8 08/22/2014 1405  ? HCT 45.2 08/22/2014 1405  ? MCV 83.4 08/22/2014 1405  ? MCH 27.3 08/22/2014 1405  ? MCHC 32.7 08/22/2014 1405  ? ? ?No results found for: POCLITH, LITHIUM  ? ?No results found for: PHENYTOIN, PHENOBARB, VALPROATE, CBMZ  ? ?.res ?Assessment: Plan:   ?Will continue current plan of care since target signs and symptoms are well controlled without any tolerability issues. ?She prefers to continue Adderall 20 mg 1/2 tab po BID for ADHD since this has been more affordable compared to 10 mg po BID.  ?Continue Prozac 20 mg po qd for anxiety and depression.  ?Continue Lithium 300 mg po QHS for depression.  ?Recommend continuing therapy with Stevphen Meuse, [facility].  ?Pt to follow-up in 3 months or sooner if clinically indicated.  ?Patient advised to contact office with any questions, adverse effects, or acute worsening in signs and symptoms. ? ?Jennifer Clements was seen today for follow-up. ? ?Diagnoses and all orders for this visit: ? ?Attention deficit hyperactivity disorder (ADHD), predominantly inattentive type ?-     amphetamine-dextroamphetamine (ADDERALL) 20 MG tablet; Take 1/2 tab BID. ?-      amphetamine-dextroamphetamine (ADDERALL) 20 MG tablet; Take 0.5 tablets (10 mg total) by mouth 2 (two) times daily. ?-     amphetamine-dextroamphetamine (ADDERALL) 20 MG tablet; Take 0.5 tablets (10 mg total) by mouth 2 (two) times daily. ? ?Mild episode of recurrent major depressive disorder (HCC) ?-     FLUoxetine (PROZAC) 20 MG capsule; TAKE 1 CAPSULE(20 MG) BY MOUTH DAILY ?-     lithium carbonate 300 MG capsule; TAKE 1 CAPSULE(300 MG) BY MOUTH AT BEDTIME ? ?Generalized anxiety disorder ?-     FLUoxetine (PROZAC) 20 MG capsule; TAKE 1 CAPSULE(20 MG) BY MOUTH DAILY ? ?  ? ?Please see After Visit Summary for patient specific instructions. ? ?Future Appointments  ?Date Time Provider Department Center  ?06/08/2022 11:30 AM Corie Chiquito, PMHNP CP-CP None  ? ? ?No orders of the defined types were placed in this encounter. ? ? ?------------------------------- ?

## 2022-06-08 ENCOUNTER — Encounter: Payer: Self-pay | Admitting: Psychiatry

## 2022-06-08 ENCOUNTER — Ambulatory Visit (INDEPENDENT_AMBULATORY_CARE_PROVIDER_SITE_OTHER): Payer: BC Managed Care – PPO | Admitting: Psychiatry

## 2022-06-08 VITALS — BP 129/77 | HR 76

## 2022-06-08 DIAGNOSIS — F330 Major depressive disorder, recurrent, mild: Secondary | ICD-10-CM | POA: Diagnosis not present

## 2022-06-08 DIAGNOSIS — F411 Generalized anxiety disorder: Secondary | ICD-10-CM | POA: Diagnosis not present

## 2022-06-08 DIAGNOSIS — F900 Attention-deficit hyperactivity disorder, predominantly inattentive type: Secondary | ICD-10-CM

## 2022-06-08 MED ORDER — FLUOXETINE HCL 20 MG PO CAPS: ORAL_CAPSULE | ORAL | 1 refills | Status: DC

## 2022-06-08 MED ORDER — ALPRAZOLAM 0.5 MG PO TABS: ORAL_TABLET | ORAL | 2 refills | Status: DC

## 2022-06-08 MED ORDER — LITHIUM CARBONATE 300 MG PO CAPS: ORAL_CAPSULE | ORAL | 0 refills | Status: DC

## 2022-06-08 NOTE — Progress Notes (Signed)
Jennifer Clements [id number] July 16, 1986 36 y.o.  Subjective:   Patient ID:  Jennifer Clements is a 36 y.o. (DOB [birth year]) female.  Chief Complaint:  Chief Complaint  Patient presents with   Anxiety   Follow-up    Depression, ADHD, and insomnia    HPI Jennifer Clements presents to the office today for follow-up of anxiety, depression, ADHD, and insomnia. Jennifer Clements reports that Jennifer Clements has been doing well overall. Jennifer Clements reports that her mood has been "kind of wobbly" in response to some stressors. Jennifer Clements reports that stressors have caused some anxiety and mild depression at times. Jennifer Clements reports that Jennifer Clements has used Xanax prn on a few occasions due to anxiety in response to stressors. Jennifer Clements reports that anxiety has been manageable. Has had some sleep disturbance and some difficulty unwinding at night. Appetite has been slightly reduced. Jennifer Clements has trying to be intentional about eating. Concentration has been fair. Jennifer Clements has been using coping strategies to help with staying on task. Energy and motivation have been good. Denies SI.   Has had some work stress.   Periodically spending time with friends.   Adderall last filled 4/24.   Xanax last filled 05/25/22 x 3.    Past Psychiatric Medication Trials: Prozac- Effective for anxiety. Took low dose, possibly 10 mg. Does not recall side effects. Zoloft- Effective and then no longer seemed to be as effective. Had discontinuation s/s. Stopped in 2015   Adderall IR- Effective. Does not recall side effects. Typically felt more calm. May have taken 10 mg 1/2-1 tab BID Trazodone- Ineffective Silenor Dayvigo- Excessive daytime somnolence Xanax- helpful for insomnia.  Deplin- no improvement.    Review of Systems:  Review of Systems  Cardiovascular:  Negative for palpitations.  Musculoskeletal:  Negative for gait problem.  Psychiatric/Behavioral:         Please refer to HPI    Medications: I have reviewed the patient's current medications.  Current Outpatient Medications   Medication Sig Dispense Refill   amphetamine-dextroamphetamine (ADDERALL) 20 MG tablet Take 1/2 tab BID. 30 tablet 0   cholecalciferol (VITAMIN D3) 25 MCG (1000 UT) tablet Take 1,000 Units by mouth daily.     ibuprofen (ADVIL) 200 MG tablet Take 200 mg by mouth every 6 (six) hours as needed.     Multiple Vitamins-Minerals (MULTIVITAMIN WOMEN PO) Take by mouth.     norethindrone-ethinyl estradiol (LOESTRIN) 1-20 MG-MCG tablet TK 1 T PO QD     ALPRAZolam (XANAX) 0.5 MG tablet Take 1/2-1 tab po qd prn panic 10 tablet 2   amphetamine-dextroamphetamine (ADDERALL) 20 MG tablet Take 0.5 tablets (10 mg total) by mouth 2 (two) times daily. 30 tablet 0   amphetamine-dextroamphetamine (ADDERALL) 20 MG tablet Take 0.5 tablets (10 mg total) by mouth 2 (two) times daily. 30 tablet 0   FLUoxetine (PROZAC) 20 MG capsule TAKE 1 CAPSULE(20 MG) BY MOUTH DAILY 90 capsule 1   lithium carbonate 300 MG capsule TAKE 1 CAPSULE(300 MG) BY MOUTH AT BEDTIME 90 capsule 0   No current facility-administered medications for this visit.    Medication Side Effects: None  Allergies: No Known Allergies  Past Medical History:  Diagnosis Date   Depression     Past Medical History, Surgical history, Social history, and Family history were reviewed and updated as appropriate.   Please see review of systems for further details on the patient's review from today.   Objective:   Physical Exam:  BP 129/77   Pulse 76   Physical Exam Constitutional:  General: Jennifer Clements is not in acute distress. Musculoskeletal:        General: No deformity.  Neurological:     Mental Status: Jennifer Clements is alert and oriented to person, place, and time.     Coordination: Coordination normal.  Psychiatric:        Attention and Perception: Attention and perception normal. Jennifer Clements does not perceive auditory or visual hallucinations.        Mood and Affect: Affect is not labile, blunt, angry or inappropriate.        Speech: Speech normal.         Behavior: Behavior normal.        Thought Content: Thought content normal. Thought content is not paranoid or delusional. Thought content does not include homicidal or suicidal ideation. Thought content does not include homicidal or suicidal plan.        Cognition and Memory: Cognition and memory normal.        Judgment: Judgment normal.     Comments: Insight intact Mood is mildly anxious and depressed in response to psychosocial stressors     Lab Review:  No results found for: "NA", "K", "CL", "CO2", "GLUCOSE", "BUN", "CREATININE", "CALCIUM", "PROT", "ALBUMIN", "AST", "ALT", "ALKPHOS", "BILITOT", "GFRNONAA", "GFRAA"     Component Value Date/Time   WBC 10.1 08/22/2014 1405   RBC 5.42 08/22/2014 1405   HGB 14.8 08/22/2014 1405   HCT 45.2 08/22/2014 1405   MCV 83.4 08/22/2014 1405   MCH 27.3 08/22/2014 1405   MCHC 32.7 08/22/2014 1405    No results found for: "POCLITH", "LITHIUM"   No results found for: "PHENYTOIN", "PHENOBARB", "VALPROATE", "CBMZ"   .res Assessment: Plan:    Pt seen for 35 minutes and time spent reviewing treatment plan. Jennifer Clements reports that Jennifer Clements would like to continue current medications without changes at this time since Jennifer Clements attributes some increase in anxiety and depression to recent psychosocial stressors. Discussed strategies to possible improve sleep.  Discussed that Jennifer Clements has 2 Adderall scripts on file at her pharmacy. Pt reports that this should be adequate over the next 3 months. Advised to contact office if Jennifer Clements needs an additional script before her next apt.  Will continue Prozac 20 mg po qd for anxiety and depression.  Continue Lithium 300 mg po QHS for mood symptoms.  Continue alprazolam 0.5 mg 1/2-1 tab po qd prn panic.  Pt to follow-up in 3 months or sooner if clinically indicated.  Patient advised to contact office with any questions, adverse effects, or acute worsening in signs and symptoms.   Jennifer Clements was seen today for anxiety and  follow-up.  Diagnoses and all orders for this visit:  Generalized anxiety disorder -     ALPRAZolam (XANAX) 0.5 MG tablet; Take 1/2-1 tab po qd prn panic -     FLUoxetine (PROZAC) 20 MG capsule; TAKE 1 CAPSULE(20 MG) BY MOUTH DAILY  Mild episode of recurrent major depressive disorder (HCC) -     FLUoxetine (PROZAC) 20 MG capsule; TAKE 1 CAPSULE(20 MG) BY MOUTH DAILY -     lithium carbonate 300 MG capsule; TAKE 1 CAPSULE(300 MG) BY MOUTH AT BEDTIME  Attention deficit hyperactivity disorder (ADHD), predominantly inattentive type     Please see After Visit Summary for patient specific instructions.  Future Appointments  Date Time Provider Department Center  09/07/2022 10:30 AM Corie Chiquito, PMHNP [facility] None    No orders of the defined types were placed in this encounter.   -------------------------------

## 2022-06-26 ENCOUNTER — Telehealth: Payer: Self-pay | Admitting: Psychiatry

## 2022-06-26 ENCOUNTER — Other Ambulatory Visit: Payer: Self-pay

## 2022-06-26 DIAGNOSIS — F900 Attention-deficit hyperactivity disorder, predominantly inattentive type: Secondary | ICD-10-CM

## 2022-06-26 MED ORDER — AMPHETAMINE-DEXTROAMPHETAMINE 20 MG PO TABS: 10.0000 mg | ORAL_TABLET | Freq: Two times a day (BID) | ORAL | 0 refills | Status: DC

## 2022-06-26 NOTE — Telephone Encounter (Signed)
Pt lvm that she needs a refill on her generic adderal 20 mg . Pharmacy is [facility] on [address]

## 2022-06-26 NOTE — Telephone Encounter (Signed)
Pended.

## 2022-09-07 ENCOUNTER — Encounter: Payer: Self-pay | Admitting: Psychiatry

## 2022-09-07 ENCOUNTER — Ambulatory Visit (INDEPENDENT_AMBULATORY_CARE_PROVIDER_SITE_OTHER): Payer: BC Managed Care – PPO | Admitting: Psychiatry

## 2022-09-07 VITALS — BP 123/78 | HR 85

## 2022-09-07 DIAGNOSIS — F411 Generalized anxiety disorder: Secondary | ICD-10-CM

## 2022-09-07 DIAGNOSIS — F900 Attention-deficit hyperactivity disorder, predominantly inattentive type: Secondary | ICD-10-CM

## 2022-09-07 DIAGNOSIS — F330 Major depressive disorder, recurrent, mild: Secondary | ICD-10-CM | POA: Diagnosis not present

## 2022-09-07 NOTE — Progress Notes (Signed)
Jennifer Clements [id number] 11-27-1985 36 y.o.  Subjective:   Patient ID:  Jennifer Clements is a 36 y.o. (DOB [birth year]) female.  Chief Complaint:  Chief Complaint  Patient presents with   Follow-up    Anxiety, depression, and ADHD    HPI [name] presents to the office today for follow-up of anxiety, depression, and ADHD. She is working 2 week notice and has accepted a new job offer. She has some anxiety and excitement with job change. She anticipates job change will be positive. Denies panic attacks. Felt the start of panic and took 1/2 Xanax and it resolved. She has had some depression for several days in response to world events. Sleep is fair to poor and anticipates this improving after situational stress. Energy and motivation have been good. Concentration is fair "but the Adderall helps." She anticipates possibly taking Adderall more in her new position. Appetite has been fluctuating. She has been trying to ensure she eats some protein during the day. Denies SI.   New job will be in [place]. She will work Monday-Friday with half days on Friday.   Adderall last filled 06/30/22. Xanax last filled 06/08/22   Past Psychiatric Medication Trials: Prozac- Effective for anxiety. Took low dose, possibly 10 mg. Does not recall side effects. Zoloft- Effective and then no longer seemed to be as effective. Had discontinuation s/s. Stopped in 2015   Adderall IR- Effective. Does not recall side effects. Typically felt more calm. May have taken 10 mg 1/2-1 tab BID Trazodone- Ineffective Silenor Dayvigo- Excessive daytime somnolence Xanax- helpful for insomnia.  Deplin- no improvement.  Review of Systems:  Review of Systems  Cardiovascular:  Negative for palpitations.  Musculoskeletal:  Negative for gait problem.  Neurological:  Positive for headaches.       Some headaches that may be stress and weather related  Psychiatric/Behavioral:         Please refer to HPI    Medications: I  have reviewed the patient's current medications.  Current Outpatient Medications  Medication Sig Dispense Refill   ALPRAZolam (XANAX) 0.5 MG tablet Take 1/2-1 tab po qd prn panic 10 tablet 2   amphetamine-dextroamphetamine (ADDERALL) 20 MG tablet Take 1/2 tab BID. 30 tablet 0   amphetamine-dextroamphetamine (ADDERALL) 20 MG tablet Take 0.5 tablets (10 mg total) by mouth 2 (two) times daily. 30 tablet 0   amphetamine-dextroamphetamine (ADDERALL) 20 MG tablet Take 0.5 tablets (10 mg total) by mouth 2 (two) times daily. 30 tablet 0   cholecalciferol (VITAMIN D3) 25 MCG (1000 UT) tablet Take 1,000 Units by mouth daily.     FLUoxetine (PROZAC) 20 MG capsule TAKE 1 CAPSULE(20 MG) BY MOUTH DAILY 90 capsule 1   ibuprofen (ADVIL) 200 MG tablet Take 200 mg by mouth every 6 (six) hours as needed.     lithium carbonate 300 MG capsule TAKE 1 CAPSULE(300 MG) BY MOUTH AT BEDTIME 90 capsule 0   Multiple Vitamins-Minerals (MULTIVITAMIN WOMEN PO) Take by mouth.     norethindrone-ethinyl estradiol (LOESTRIN) 1-20 MG-MCG tablet TK 1 T PO QD     No current facility-administered medications for this visit.    Medication Side Effects: None  Allergies: No Known Allergies  Past Medical History:  Diagnosis Date   Depression     Past Medical History, Surgical history, Social history, and Family history were reviewed and updated as appropriate.   Please see review of systems for further details on the patient's review from today.   Objective:   Physical Exam:  BP 123/78   Pulse 85   Physical Exam Constitutional:      General: She is not in acute distress. Musculoskeletal:        General: No deformity.  Neurological:     Mental Status: She is alert and oriented to person, place, and time.     Coordination: Coordination normal.  Psychiatric:        Attention and Perception: Attention and perception normal. She does not perceive auditory or visual hallucinations.        Mood and Affect: Mood is not  depressed. Affect is not labile, blunt, angry or inappropriate.        Speech: Speech normal.        Behavior: Behavior normal.        Thought Content: Thought content normal. Thought content is not paranoid or delusional. Thought content does not include homicidal or suicidal ideation. Thought content does not include homicidal or suicidal plan.        Cognition and Memory: Cognition and memory normal.        Judgment: Judgment normal.     Comments: Insight intact Mood is mildly anxious in response to job transition and attempting to complete tasks before last day of work     Lab Review:  No results found for: "NA", "K", "CL", "CO2", "GLUCOSE", "BUN", "CREATININE", "CALCIUM", "PROT", "ALBUMIN", "AST", "ALT", "ALKPHOS", "BILITOT", "GFRNONAA", "GFRAA"     Component Value Date/Time   WBC 10.1 08/22/2014 1405   RBC 5.42 08/22/2014 1405   HGB 14.8 08/22/2014 1405   HCT 45.2 08/22/2014 1405   MCV 83.4 08/22/2014 1405   MCH 27.3 08/22/2014 1405   MCHC 32.7 08/22/2014 1405    No results found for: "POCLITH", "LITHIUM"   No results found for: "PHENYTOIN", "PHENOBARB", "VALPROATE", "CBMZ"   .res Assessment: Plan:   Will continue current plan of care since target signs and symptoms are well controlled without any tolerability issues. She requests that provider not send scripts at this time since she recently filled medications and will be transitioning insurance plans during job change. She reports that she will contact office when she would like new scripts sent after her new insurance is effective.  Discussed that she may be taking Adderall more consistently after change in jobs. Will continue Adderall 20 mg 1/2 tab BID for ADHD.  Will continue Prozac 20 mg po qd for anxiety and depression.  Continue Lithium 300 mg po QHS for mood symptoms.  Continue alprazolam 0.5 mg 1/2-1 tab po qd prn panic.  Pt to follow-up in 3 months or sooner if clinically indicated.  Recommend continuing therapy  with Stevphen Meuse, [facility].  Patient advised to contact office with any questions, adverse effects, or acute worsening in signs and symptoms.   [name] was seen today for follow-up.  Diagnoses and all orders for this visit:  Generalized anxiety disorder  Attention deficit hyperactivity disorder (ADHD), predominantly inattentive type  Mild episode of recurrent major depressive disorder (HCC)     Please see After Visit Summary for patient specific instructions.  Future Appointments  Date Time Provider Department Center  12/04/2022  1:45 PM Corie Chiquito, PMHNP [facility] None    No orders of the defined types were placed in this encounter.   -------------------------------

## 2022-12-04 ENCOUNTER — Encounter: Payer: Self-pay | Admitting: Psychiatry

## 2022-12-04 ENCOUNTER — Ambulatory Visit (INDEPENDENT_AMBULATORY_CARE_PROVIDER_SITE_OTHER): Payer: BC Managed Care – PPO | Admitting: Psychiatry

## 2022-12-04 DIAGNOSIS — F900 Attention-deficit hyperactivity disorder, predominantly inattentive type: Secondary | ICD-10-CM | POA: Diagnosis not present

## 2022-12-04 DIAGNOSIS — F330 Major depressive disorder, recurrent, mild: Secondary | ICD-10-CM | POA: Diagnosis not present

## 2022-12-04 DIAGNOSIS — F411 Generalized anxiety disorder: Secondary | ICD-10-CM

## 2022-12-04 MED ORDER — AMPHETAMINE-DEXTROAMPHETAMINE 20 MG PO TABS: ORAL_TABLET | ORAL | 0 refills | Status: DC

## 2022-12-04 MED ORDER — LITHIUM CARBONATE 300 MG PO CAPS: ORAL_CAPSULE | ORAL | 1 refills | Status: DC

## 2022-12-04 MED ORDER — FLUOXETINE HCL 20 MG PO CAPS: ORAL_CAPSULE | ORAL | 1 refills | Status: DC

## 2022-12-04 MED ORDER — ALPRAZOLAM 0.5 MG PO TABS: ORAL_TABLET | ORAL | 2 refills | Status: DC

## 2022-12-04 NOTE — Progress Notes (Signed)
Jennifer Clements [id number] 10-07-1986 37 y.o.  Subjective:   Patient ID:  Jennifer Clements is a 37 y.o. (DOB 1986-01-11) female.  Chief Complaint:  Chief Complaint  Patient presents with   Anxiety    Anxiety Patient reports no palpitations.     Jennifer Clements presents to the office today for follow-up of anxiety, ADHD, and Depression.  She reports that her new job is going well. Work stress has lessened. Mother has been out of country almost 3 months. Mother has been in [place] to care for father. Grandfather died last week. Family has received support. Mother will be returning on Monday.   She has had some depression in response to family events. Some worry about her mother that has been taking care of family. Notices some increased anxiety in general with some intrusive thoughts. Some disrupted sleep with stress. She reports getting a fair amount of sleep. She reports that she has more energy after work. Motivation has been good. Concentration has been "sporadic" and Adderall remains effective. Appetite has been normal. Denies SI.   Father had COVID right after Christmas. Sister was in an MVA.   Adderall last filled 09/06/22. Alprazolam last filled 09/06/22.  Past Psychiatric Medication Trials: Prozac- Effective for anxiety. Took low dose, possibly 10 mg. Does not recall side effects. Zoloft- Effective and then no longer seemed to be as effective. Had discontinuation s/s. Stopped in 2015   Adderall IR- Effective. Does not recall side effects. Typically felt more calm. May have taken 10 mg 1/2-1 tab BID Trazodone- Ineffective Silenor Dayvigo- Excessive daytime somnolence Xanax- helpful for insomnia.  Deplin- no improvement.    Review of Systems:  Review of Systems  Cardiovascular:  Negative for palpitations.  Musculoskeletal:  Negative for gait problem.  Neurological:  Negative for tremors.  Psychiatric/Behavioral:         Please refer to HPI    Medications: I have reviewed  the patient's current medications.  Current Outpatient Medications  Medication Sig Dispense Refill   cholecalciferol (VITAMIN D3) 25 MCG (1000 UT) tablet Take 1,000 Units by mouth daily.     ibuprofen (ADVIL) 200 MG tablet Take 200 mg by mouth every 6 (six) hours as needed.     Multiple Vitamins-Minerals (MULTIVITAMIN WOMEN PO) Take by mouth.     norethindrone-ethinyl estradiol (LOESTRIN) 1-20 MG-MCG tablet TK 1 T PO QD     ALPRAZolam (XANAX) 0.5 MG tablet Take 1/2-1 tab po qd prn panic 10 tablet 2   amphetamine-dextroamphetamine (ADDERALL) 20 MG tablet Take 0.5 tablets (10 mg total) by mouth 2 (two) times daily. 30 tablet 0   amphetamine-dextroamphetamine (ADDERALL) 20 MG tablet Take 0.5 tablets (10 mg total) by mouth 2 (two) times daily. 30 tablet 0   amphetamine-dextroamphetamine (ADDERALL) 20 MG tablet Take 1/2-1 tab BID. 60 tablet 0   FLUoxetine (PROZAC) 20 MG capsule TAKE 1 CAPSULE(20 MG) BY MOUTH DAILY 90 capsule 1   lithium carbonate 300 MG capsule TAKE 1 CAPSULE(300 MG) BY MOUTH AT BEDTIME 90 capsule 1   No current facility-administered medications for this visit.    Medication Side Effects: None  Allergies: No Known Allergies  Past Medical History:  Diagnosis Date   Depression     Past Medical History, Surgical history, Social history, and Family history were reviewed and updated as appropriate.   Please see review of systems for further details on the patient's review from today.   Objective:   Physical Exam:  BP 137/84   Pulse 84  Physical Exam Constitutional:      General: She is not in acute distress. Musculoskeletal:        General: No deformity.  Neurological:     Mental Status: She is alert and oriented to person, place, and time.     Coordination: Coordination normal.  Psychiatric:        Attention and Perception: Attention and perception normal. She does not perceive auditory or visual hallucinations.        Mood and Affect: Affect is not labile,  blunt, angry or inappropriate.        Speech: Speech normal.        Behavior: Behavior normal.        Thought Content: Thought content normal. Thought content is not paranoid or delusional. Thought content does not include homicidal or suicidal ideation. Thought content does not include homicidal or suicidal plan.        Cognition and Memory: Cognition and memory normal.        Judgment: Judgment normal.     Comments: Insight intact Mood is appropriate to content affect is congruent     Lab Review:  No results found for: "NA", "K", "CL", "CO2", "GLUCOSE", "BUN", "CREATININE", "CALCIUM", "PROT", "ALBUMIN", "AST", "ALT", "ALKPHOS", "BILITOT", "GFRNONAA", "GFRAA"     Component Value Date/Time   WBC 10.1 08/22/2014 1405   RBC 5.42 08/22/2014 1405   HGB 14.8 08/22/2014 1405   HCT 45.2 08/22/2014 1405   MCV 83.4 08/22/2014 1405   MCH 27.3 08/22/2014 1405   MCHC 32.7 08/22/2014 1405    No results found for: "POCLITH", "LITHIUM"   No results found for: "PHENYTOIN", "PHENOBARB", "VALPROATE", "CBMZ"   .res Assessment: Plan:    Will change Adderall script to 20 mg 1/2-1 tab po BID for ADHD to increase quantity to #60 since pt reports frequent difficulty at pharmacy with getting Adderall filled and this results in lapses in treatment.  Continue Prozac 20 mg po qd for anxiety and depression.  Continue Lithium 300 mg po QHS for mood symptoms.  Continue Alprazolam 0.5 mg 1/2-1 tab po qd prn panic. Pt to follow-up in 3 months or sooner if clinically indicated.  Patient advised to contact office with any questions, adverse effects, or acute worsening in signs and symptoms.   Jennifer Clements was seen today for anxiety.  Diagnoses and all orders for this visit:  Generalized anxiety disorder -     ALPRAZolam (XANAX) 0.5 MG tablet; Take 1/2-1 tab po qd prn panic -     FLUoxetine (PROZAC) 20 MG capsule; TAKE 1 CAPSULE(20 MG) BY MOUTH DAILY  Mild episode of recurrent major depressive disorder  (HCC) -     FLUoxetine (PROZAC) 20 MG capsule; TAKE 1 CAPSULE(20 MG) BY MOUTH DAILY -     lithium carbonate 300 MG capsule; TAKE 1 CAPSULE(300 MG) BY MOUTH AT BEDTIME  Attention deficit hyperactivity disorder (ADHD), predominantly inattentive type -     amphetamine-dextroamphetamine (ADDERALL) 20 MG tablet; Take 1/2-1 tab BID.     Please see After Visit Summary for patient specific instructions.  Future Appointments  Date Time Provider [place]  03/19/2023  1:30 PM Thayer Headings, PMHNP [facility] None    No orders of the defined types were placed in this encounter.   -------------------------------

## 2023-01-11 DIAGNOSIS — Z01419 Encounter for gynecological examination (general) (routine) without abnormal findings: Secondary | ICD-10-CM | POA: Diagnosis not present

## 2023-02-17 ENCOUNTER — Telehealth: Payer: Self-pay | Admitting: Psychiatry

## 2023-02-17 DIAGNOSIS — F900 Attention-deficit hyperactivity disorder, predominantly inattentive type: Secondary | ICD-10-CM

## 2023-02-17 MED ORDER — AMPHETAMINE-DEXTROAMPHETAMINE 20 MG PO TABS: 10.0000 mg | ORAL_TABLET | Freq: Two times a day (BID) | ORAL | 0 refills | Status: DC

## 2023-02-17 NOTE — Telephone Encounter (Signed)
Last filled 01/08/23. Script sent.

## 2023-02-17 NOTE — Telephone Encounter (Signed)
Pt lvm requesting Rx generic adderall 20 mg to [facility]. Apt 5/3

## 2023-03-19 ENCOUNTER — Encounter: Payer: Self-pay | Admitting: Psychiatry

## 2023-03-19 ENCOUNTER — Ambulatory Visit (INDEPENDENT_AMBULATORY_CARE_PROVIDER_SITE_OTHER): Payer: BC Managed Care – PPO | Admitting: Psychiatry

## 2023-03-19 VITALS — BP 131/86 | HR 86

## 2023-03-19 DIAGNOSIS — F900 Attention-deficit hyperactivity disorder, predominantly inattentive type: Secondary | ICD-10-CM

## 2023-03-19 DIAGNOSIS — F330 Major depressive disorder, recurrent, mild: Secondary | ICD-10-CM

## 2023-03-19 DIAGNOSIS — F411 Generalized anxiety disorder: Secondary | ICD-10-CM

## 2023-03-19 MED ORDER — AMPHETAMINE-DEXTROAMPHETAMINE 20 MG PO TABS: 10.0000 mg | ORAL_TABLET | Freq: Two times a day (BID) | ORAL | 0 refills | Status: DC

## 2023-03-19 NOTE — Progress Notes (Unsigned)
Shecid Clements [id number] [birth year] 37 y.o.  Subjective:   Patient ID:  Jennifer Clements is a 37 y.o. (DOB Jul 28, 1986) female.  Chief Complaint: No chief complaint on file.   HPI [name] presents to the office today for follow-up of anxiety, depression, and ADHD. She reports that she has been "busy." She reports that she knows what to expect with her job and job is more predictable. She reports, "the anxiety is still there." She reports that her anxiety has been manageable. She reports that panic has been "very low key" and improved with changing environment or using coping skills. She reports that her depression has been "up and down." She reports recent period of mild depression. She has been grieving the loss of her grandfather. Energy and motivation have been ok with some temporary dips. Sleep has been ok recently. Had period of disrupted sleep. Appetite has been normal. Concentration has been ok. She has been using Adderall when doing more detailed projects. Denies SI.   Planning to go home in July.  Sister has been needing some assistance from her. Sister has been without a vehicle. Her grandmother is doing ok.   Adderall last filled 02/17/23. Alprazolam last filled 12/04/22.  Past Psychiatric Medication Trials: Prozac- Effective for anxiety. Took low dose, possibly 10 mg. Does not recall side effects. Zoloft- Effective and then no longer seemed to be as effective. Had discontinuation s/s. Stopped in 2015   Adderall IR- Effective. Does not recall side effects. Typically felt more calm. May have taken 10 mg 1/2-1 tab BID Trazodone- Ineffective Silenor Dayvigo- Excessive daytime somnolence Xanax- helpful for insomnia.  Deplin- no improvement.  Review of Systems:  Review of Systems  Musculoskeletal:  Negative for gait problem.  Allergic/Immunologic: Positive for environmental allergies.  Psychiatric/Behavioral:         Please refer to HPI    Medications: I have reviewed the  patient's current medications.  Current Outpatient Medications  Medication Sig Dispense Refill   ALPRAZolam (XANAX) 0.5 MG tablet Take 1/2-1 tab po qd prn panic 10 tablet 2   amphetamine-dextroamphetamine (ADDERALL) 20 MG tablet Take 0.5 tablets (10 mg total) by mouth 2 (two) times daily. 30 tablet 0   FLUoxetine (PROZAC) 20 MG capsule TAKE 1 CAPSULE(20 MG) BY MOUTH DAILY 90 capsule 1   ibuprofen (ADVIL) 200 MG tablet Take 200 mg by mouth every 6 (six) hours as needed.     lithium carbonate 300 MG capsule TAKE 1 CAPSULE(300 MG) BY MOUTH AT BEDTIME 90 capsule 1   Multiple Vitamins-Minerals (MULTIVITAMIN WOMEN PO) Take by mouth.     norethindrone-ethinyl estradiol (LOESTRIN) 1-20 MG-MCG tablet TK 1 T PO QD     amphetamine-dextroamphetamine (ADDERALL) 20 MG tablet Take 1/2-1 tab BID. 60 tablet 0   amphetamine-dextroamphetamine (ADDERALL) 20 MG tablet Take 0.5 tablets (10 mg total) by mouth 2 (two) times daily. 30 tablet 0   cholecalciferol (VITAMIN D3) 25 MCG (1000 UT) tablet Take 1,000 Units by mouth daily. (Patient not taking: Reported on 03/19/2023)     No current facility-administered medications for this visit.    Medication Side Effects: None  Allergies: No Known Allergies  Past Medical History:  Diagnosis Date   Depression     Past Medical History, Surgical history, Social history, and Family history were reviewed and updated as appropriate.   Please see review of systems for further details on the patient's review from today.   Objective:   Physical Exam:  There were no vitals taken for this  visit.  Physical Exam  Lab Review:  No results found for: "NA", "K", "CL", "CO2", "GLUCOSE", "BUN", "CREATININE", "CALCIUM", "PROT", "ALBUMIN", "AST", "ALT", "ALKPHOS", "BILITOT", "GFRNONAA", "GFRAA"     Component Value Date/Time   WBC 10.1 08/22/2014 1405   RBC 5.42 08/22/2014 1405   HGB 14.8 08/22/2014 1405   HCT 45.2 08/22/2014 1405   MCV 83.4 08/22/2014 1405   MCH 27.3  08/22/2014 1405   MCHC 32.7 08/22/2014 1405    No results found for: "POCLITH", "LITHIUM"   No results found for: "PHENYTOIN", "PHENOBARB", "VALPROATE", "CBMZ"   .res Assessment: Plan:    There are no diagnoses linked to this encounter.   Please see After Visit Summary for patient specific instructions.  No future appointments.  No orders of the defined types were placed in this encounter.   -------------------------------

## 2023-04-29 DIAGNOSIS — R202 Paresthesia of skin: Secondary | ICD-10-CM | POA: Diagnosis not present

## 2023-06-10 DIAGNOSIS — R202 Paresthesia of skin: Secondary | ICD-10-CM | POA: Diagnosis not present

## 2023-06-10 DIAGNOSIS — F419 Anxiety disorder, unspecified: Secondary | ICD-10-CM | POA: Diagnosis not present

## 2023-06-10 DIAGNOSIS — Z1159 Encounter for screening for other viral diseases: Secondary | ICD-10-CM | POA: Diagnosis not present

## 2023-06-10 DIAGNOSIS — F339 Major depressive disorder, recurrent, unspecified: Secondary | ICD-10-CM | POA: Diagnosis not present

## 2023-06-10 DIAGNOSIS — F900 Attention-deficit hyperactivity disorder, predominantly inattentive type: Secondary | ICD-10-CM | POA: Diagnosis not present

## 2023-06-10 DIAGNOSIS — Z0000 Encounter for general adult medical examination without abnormal findings: Secondary | ICD-10-CM | POA: Diagnosis not present

## 2023-06-10 DIAGNOSIS — Z13220 Encounter for screening for lipoid disorders: Secondary | ICD-10-CM | POA: Diagnosis not present

## 2023-06-18 ENCOUNTER — Encounter: Payer: Self-pay | Admitting: Psychiatry

## 2023-06-18 ENCOUNTER — Ambulatory Visit (INDEPENDENT_AMBULATORY_CARE_PROVIDER_SITE_OTHER): Payer: BC Managed Care – PPO | Admitting: Psychiatry

## 2023-06-18 DIAGNOSIS — F411 Generalized anxiety disorder: Secondary | ICD-10-CM

## 2023-06-18 DIAGNOSIS — F330 Major depressive disorder, recurrent, mild: Secondary | ICD-10-CM

## 2023-06-18 DIAGNOSIS — F900 Attention-deficit hyperactivity disorder, predominantly inattentive type: Secondary | ICD-10-CM | POA: Diagnosis not present

## 2023-06-18 MED ORDER — AMPHETAMINE-DEXTROAMPHETAMINE 20 MG PO TABS
10.0000 mg | ORAL_TABLET | Freq: Two times a day (BID) | ORAL | 0 refills | Status: AC
Start: 2023-08-13 — End: ?

## 2023-06-18 MED ORDER — AMPHETAMINE-DEXTROAMPHETAMINE 20 MG PO TABS
10.0000 mg | ORAL_TABLET | Freq: Two times a day (BID) | ORAL | 0 refills | Status: DC
Start: 2023-07-16 — End: 2023-09-24

## 2023-06-18 MED ORDER — FLUOXETINE HCL 20 MG PO CAPS
ORAL_CAPSULE | ORAL | 1 refills | Status: DC
Start: 2023-06-18 — End: 2023-09-24

## 2023-06-18 MED ORDER — ALPRAZOLAM 0.5 MG PO TABS
ORAL_TABLET | ORAL | 3 refills | Status: AC
Start: 2023-06-18 — End: ?

## 2023-06-18 MED ORDER — LITHIUM CARBONATE 300 MG PO CAPS
ORAL_CAPSULE | ORAL | 1 refills | Status: DC
Start: 2023-06-18 — End: 2023-09-24

## 2023-06-18 MED ORDER — AMPHETAMINE-DEXTROAMPHETAMINE 20 MG PO TABS
ORAL_TABLET | ORAL | 0 refills | Status: DC
Start: 2023-06-18 — End: 2023-09-24

## 2023-06-18 NOTE — Progress Notes (Signed)
Jennifer Clements [id number] 1986/07/09 37 y.o.  Subjective:   Patient ID:  Jennifer Clements is a 37 y.o. (DOB 01/27/1986) female.  Chief Complaint:  Chief Complaint  Patient presents with   Follow-up    Depression, anxiety, ADHD, and insomnia    HPI Jennifer Clements presents to the office today for follow-up of depression, anxiety, ADHD, and insomnia. Jennifer Clements reports that Jennifer Clements was found to have low Vitamin D and Vitamin B12. Jennifer Clements reports that Jennifer Clements was taking supplements in the past.   Jennifer Clements reports that anxiety and stress has been "manageable." Work has been very busy. Denies recent panic attacks. Some sadness in response to processing grief related to grandfather's death. Jennifer Clements reports that her energy and motivation have been ok. Appetite has been ok. Jennifer Clements reports that her sleep has been "patchy." Some difficulty with concentration and medication has been helpful. Denies SI.   Recent visit to [place] to see grandmother.   Alprazolam last filled 05/14/23.  Adderall last filled 03/19/23.   Past Psychiatric Medication Trials: Prozac- Effective for anxiety. Took low dose, possibly 10 mg. Does not recall side effects. Zoloft- Effective and then no longer seemed to be as effective. Had discontinuation s/s. Stopped in 2015   Adderall IR- Effective. Does not recall side effects. Typically felt more calm. May have taken 10 mg 1/2-1 tab BID Trazodone- Ineffective Silenor Dayvigo- Excessive daytime somnolence Xanax- helpful for insomnia.  Deplin- no improvement.  Review of Systems:  Review of Systems  Musculoskeletal:  Negative for gait problem.  Neurological:        Jennifer Clements reports that tingling in her feet has mostly resolved  Psychiatric/Behavioral:         Please refer to HPI    Medications: I have reviewed the patient's current medications.  Current Outpatient Medications  Medication Sig Dispense Refill   Cholecalciferol (VITAMIN D-3) 125 MCG (5000 UT) TABS Take by mouth.     Cyanocobalamin (VITAMIN B  12 PO) Take by mouth.     ibuprofen (ADVIL) 200 MG tablet Take 200 mg by mouth every 6 (six) hours as needed.     Multiple Vitamins-Minerals (MULTIVITAMIN WOMEN PO) Take by mouth.     norethindrone-ethinyl estradiol (LOESTRIN) 1-20 MG-MCG tablet TK 1 T PO QD     ALPRAZolam (XANAX) 0.5 MG tablet Take 1/2-1 tab po qd prn panic 10 tablet 3   [START ON 07/16/2023] amphetamine-dextroamphetamine (ADDERALL) 20 MG tablet Take 0.5 tablets (10 mg total) by mouth 2 (two) times daily. 30 tablet 0   [START ON 08/13/2023] amphetamine-dextroamphetamine (ADDERALL) 20 MG tablet Take 0.5 tablets (10 mg total) by mouth 2 (two) times daily. 30 tablet 0   amphetamine-dextroamphetamine (ADDERALL) 20 MG tablet Take 1/2-1 tab BID. 60 tablet 0   FLUoxetine (PROZAC) 20 MG capsule TAKE 1 CAPSULE(20 MG) BY MOUTH DAILY 90 capsule 1   lithium carbonate 300 MG capsule TAKE 1 CAPSULE(300 MG) BY MOUTH AT BEDTIME 90 capsule 1   No current facility-administered medications for this visit.    Medication Side Effects: None  Allergies: No Known Allergies  Past Medical History:  Diagnosis Date   Depression     Past Medical History, Surgical history, Social history, and Family history were reviewed and updated as appropriate.   Please see review of systems for further details on the patient's review from today.   Objective:   Physical Exam:  BP 124/81   Pulse 80   Physical Exam Constitutional:      General: Jennifer Clements is not in  acute distress. Musculoskeletal:        General: No deformity.  Neurological:     Mental Status: Jennifer Clements is alert and oriented to person, place, and time.     Coordination: Coordination normal.  Psychiatric:        Attention and Perception: Attention and perception normal. Jennifer Clements does not perceive auditory or visual hallucinations.        Mood and Affect: Affect is not labile, blunt, angry or inappropriate.        Speech: Speech normal.        Behavior: Behavior normal.        Thought Content: Thought  content normal. Thought content is not paranoid or delusional. Thought content does not include homicidal or suicidal ideation. Thought content does not include homicidal or suicidal plan.        Cognition and Memory: Cognition and memory normal.        Judgment: Judgment normal.     Comments: Insight intact Mood is appropriate to content. Affect is congruent.     Lab Review:  No results found for: "NA", "K", "CL", "CO2", "GLUCOSE", "BUN", "CREATININE", "CALCIUM", "PROT", "ALBUMIN", "AST", "ALT", "ALKPHOS", "BILITOT", "GFRNONAA", "GFRAA"     Component Value Date/Time   WBC 10.1 08/22/2014 1405   RBC 5.42 08/22/2014 1405   HGB 14.8 08/22/2014 1405   HCT 45.2 08/22/2014 1405   MCV 83.4 08/22/2014 1405   MCH 27.3 08/22/2014 1405   MCHC 32.7 08/22/2014 1405    No results found for: "POCLITH", "LITHIUM"   No results found for: "PHENYTOIN", "PHENOBARB", "VALPROATE", "CBMZ"   .res Assessment: Plan:    Agree with continuing higher dose of Vitamin D supplement since pt noted to have low Vitamin D level while taking 1,000 international units daily. Discussed that having a normal vitamin D level may prevent risk of seasonal depression.  Continue Prozac 20 mg daily for depression and anxiety.  Continue Lithium 300 mg at bed time for depression.  Continue Alprazolam 0.5 mg 1/2-1 tab po every day prn anxiety.  Continue Adderall 20 mg 1/2-1 tab twice daily for ADHD.  Pt to follow-up in 3 months or sooner if clinically indicated.  Patient advised to contact office with any questions, adverse effects, or acute worsening in signs and symptoms.   Jennifer Clements was seen today for follow-up.  Diagnoses and all orders for this visit:  Generalized anxiety disorder -     ALPRAZolam (XANAX) 0.5 MG tablet; Take 1/2-1 tab po qd prn panic -     FLUoxetine (PROZAC) 20 MG capsule; TAKE 1 CAPSULE(20 MG) BY MOUTH DAILY  Attention deficit hyperactivity disorder (ADHD), predominantly inattentive type -      amphetamine-dextroamphetamine (ADDERALL) 20 MG tablet; Take 0.5 tablets (10 mg total) by mouth 2 (two) times daily. -     amphetamine-dextroamphetamine (ADDERALL) 20 MG tablet; Take 0.5 tablets (10 mg total) by mouth 2 (two) times daily. -     amphetamine-dextroamphetamine (ADDERALL) 20 MG tablet; Take 1/2-1 tab BID.  Mild episode of recurrent major depressive disorder (HCC) -     FLUoxetine (PROZAC) 20 MG capsule; TAKE 1 CAPSULE(20 MG) BY MOUTH DAILY -     lithium carbonate 300 MG capsule; TAKE 1 CAPSULE(300 MG) BY MOUTH AT BEDTIME     Please see After Visit Summary for patient specific instructions.  Future Appointments  Date Time Provider Department Center  09/24/2023  1:45 PM Corie Chiquito, PMHNP [facility] None    No orders of the defined types were placed  in this encounter.   -------------------------------

## 2023-09-24 ENCOUNTER — Ambulatory Visit (INDEPENDENT_AMBULATORY_CARE_PROVIDER_SITE_OTHER): Payer: BC Managed Care – PPO | Admitting: Psychiatry

## 2023-09-24 ENCOUNTER — Encounter: Payer: Self-pay | Admitting: Psychiatry

## 2023-09-24 DIAGNOSIS — F900 Attention-deficit hyperactivity disorder, predominantly inattentive type: Secondary | ICD-10-CM

## 2023-09-24 DIAGNOSIS — F411 Generalized anxiety disorder: Secondary | ICD-10-CM | POA: Diagnosis not present

## 2023-09-24 DIAGNOSIS — F330 Major depressive disorder, recurrent, mild: Secondary | ICD-10-CM

## 2023-09-24 MED ORDER — AMPHETAMINE-DEXTROAMPHETAMINE 20 MG PO TABS
ORAL_TABLET | ORAL | 0 refills | Status: AC
Start: 2023-12-16 — End: ?

## 2023-09-24 MED ORDER — FLUOXETINE HCL 20 MG PO CAPS
ORAL_CAPSULE | ORAL | 1 refills | Status: AC
Start: 2023-09-24 — End: ?

## 2023-09-24 MED ORDER — AMPHETAMINE-DEXTROAMPHETAMINE 20 MG PO TABS
ORAL_TABLET | ORAL | 0 refills | Status: AC
Start: 2024-01-13 — End: ?

## 2023-09-24 MED ORDER — LITHIUM CARBONATE 300 MG PO CAPS
ORAL_CAPSULE | ORAL | 1 refills | Status: AC
Start: 2023-09-24 — End: ?

## 2023-09-24 MED ORDER — AMPHETAMINE-DEXTROAMPHETAMINE 20 MG PO TABS
ORAL_TABLET | ORAL | 0 refills | Status: AC
Start: 2023-11-18 — End: ?

## 2023-09-24 MED ORDER — AMPHETAMINE-DEXTROAMPHETAMINE 20 MG PO TABS
10.0000 mg | ORAL_TABLET | Freq: Two times a day (BID) | ORAL | 0 refills | Status: DC
Start: 2023-11-18 — End: 2023-09-24

## 2023-09-24 NOTE — Progress Notes (Signed)
Jennifer Clements [id number] [birth year] 37 y.o.  Subjective:   Patient ID:  Jennifer Clements is a 37 y.o. (DOB May 31, 1986) female.  Chief Complaint:  Chief Complaint  Patient presents with   Follow-up    Anxiety, depression, ADHD, and insomnia    HPI Jennifer Clements presents to the office today for follow-up of anxiety, depression, insomnia, and ADHD. She reports increased anxiety and worry. She reports some mild panic. She has been doing stretches and deep breathing to help manage anxiety. She reports that she has been reframing things and focusing on what she can control. She reports that she has had some mild depression. Some decrease in energy and motivation. Sleep has been somewhat disrupted. She has been trying to get to bed earlier. Appetite has been normal. Some difficulty with concentration and reports that overall it has been manageable. Denies SI.   Family is doing ok.     Past Psychiatric Medication Trials: Prozac- Effective for anxiety. Took low dose, possibly 10 mg. Does not recall side effects. Zoloft- Effective and then no longer seemed to be as effective. Had discontinuation s/s. Stopped in 2015   Adderall IR- Effective. Does not recall side effects. Typically felt more calm. May have taken 10 mg 1/2-1 tab BID Trazodone- Ineffective Silenor Dayvigo- Excessive daytime somnolence Xanax- helpful for insomnia.  Deplin- no improvement.  Review of Systems:  Review of Systems  Cardiovascular:  Negative for palpitations.  Musculoskeletal:  Negative for gait problem.  Neurological:  Positive for headaches. Negative for tremors.  Psychiatric/Behavioral:         Please refer to HPI    Medications: I have reviewed the patient's current medications.  Current Outpatient Medications  Medication Sig Dispense Refill   ALPRAZolam (XANAX) 0.5 MG tablet Take 1/2-1 tab po qd prn panic 10 tablet 3   amphetamine-dextroamphetamine (ADDERALL) 20 MG tablet Take 0.5 tablets (10 mg total) by  mouth 2 (two) times daily. 30 tablet 0   [START ON 01/13/2024] amphetamine-dextroamphetamine (ADDERALL) 20 MG tablet Take 1/2-1 tab po BID 60 tablet 0   Cholecalciferol (VITAMIN D-3) 125 MCG (5000 UT) TABS Take by mouth.     Cyanocobalamin (VITAMIN B 12 PO) Take by mouth.     ibuprofen (ADVIL) 200 MG tablet Take 200 mg by mouth every 6 (six) hours as needed.     Multiple Vitamins-Minerals (MULTIVITAMIN WOMEN PO) Take by mouth.     norethindrone-ethinyl estradiol (LOESTRIN) 1-20 MG-MCG tablet TK 1 T PO QD     [START ON 12/16/2023] amphetamine-dextroamphetamine (ADDERALL) 20 MG tablet Take 1/2-1 tab BID. 60 tablet 0   [START ON 11/18/2023] amphetamine-dextroamphetamine (ADDERALL) 20 MG tablet Take 0.5 tablets (10 mg total) by mouth 2 (two) times daily. 60 tablet 0   FLUoxetine (PROZAC) 20 MG capsule TAKE 1 CAPSULE(20 MG) BY MOUTH DAILY 90 capsule 1   lithium carbonate 300 MG capsule TAKE 1 CAPSULE(300 MG) BY MOUTH AT BEDTIME 90 capsule 1   No current facility-administered medications for this visit.    Medication Side Effects: None  Allergies: No Known Allergies  Past Medical History:  Diagnosis Date   Depression     Past Medical History, Surgical history, Social history, and Family history were reviewed and updated as appropriate.   Please see review of systems for further details on the patient's review from today.   Objective:   Physical Exam:  BP (!) 141/85   Pulse 87   Physical Exam Constitutional:      General: She is not in  acute distress. Musculoskeletal:        General: No deformity.  Neurological:     Mental Status: She is alert and oriented to person, place, and time.     Coordination: Coordination normal.  Psychiatric:        Attention and Perception: Attention and perception normal. She does not perceive auditory or visual hallucinations.        Mood and Affect: Affect is not labile, blunt, angry or inappropriate.        Speech: Speech normal.        Behavior:  Behavior normal.        Thought Content: Thought content normal. Thought content is not paranoid or delusional. Thought content does not include homicidal or suicidal ideation. Thought content does not include homicidal or suicidal plan.        Cognition and Memory: Cognition and memory normal.        Judgment: Judgment normal.     Comments: Insight intact Mood is mildly sad and anxious in response to situational stressors     Lab Review:  No results found for: "NA", "K", "CL", "CO2", "GLUCOSE", "BUN", "CREATININE", "CALCIUM", "PROT", "ALBUMIN", "AST", "ALT", "ALKPHOS", "BILITOT", "GFRNONAA", "GFRAA"     Component Value Date/Time   WBC 10.1 08/22/2014 1405   RBC 5.42 08/22/2014 1405   HGB 14.8 08/22/2014 1405   HCT 45.2 08/22/2014 1405   MCV 83.4 08/22/2014 1405   MCH 27.3 08/22/2014 1405   MCHC 32.7 08/22/2014 1405    No results found for: "POCLITH", "LITHIUM"   No results found for: "PHENYTOIN", "PHENOBARB", "VALPROATE", "CBMZ"   .res Assessment: Plan:    Pt reports that she would like to continue current medications without changes at this time. She reports possibly considering increase in Prozac in the future if she continues to notice some mild depressive s/s.  Will continue Adderall 20 mg 1/2-1 tab po BID for ADHD.  Continue Prozac 20 mg daily for anxiety and depression.  Continue Lithium 300 mg at bedtime for depression.  Continue Alprazolam 0.5 mg 1/2-1 tab po every day prn panic.  Pt to follow-up in 3-4 months or sooner if clinically indicated.  Patient advised to contact office with any questions, adverse effects, or acute worsening in signs and symptoms.   Jennifer Clements was seen today for follow-up.  Diagnoses and all orders for this visit:  Attention deficit hyperactivity disorder (ADHD), predominantly inattentive type -     amphetamine-dextroamphetamine (ADDERALL) 20 MG tablet; Take 1/2-1 tab BID. -     amphetamine-dextroamphetamine (ADDERALL) 20 MG tablet; Take  0.5 tablets (10 mg total) by mouth 2 (two) times daily. -     amphetamine-dextroamphetamine (ADDERALL) 20 MG tablet; Take 1/2-1 tab po BID  Generalized anxiety disorder -     FLUoxetine (PROZAC) 20 MG capsule; TAKE 1 CAPSULE(20 MG) BY MOUTH DAILY  Mild episode of recurrent major depressive disorder (HCC) -     FLUoxetine (PROZAC) 20 MG capsule; TAKE 1 CAPSULE(20 MG) BY MOUTH DAILY -     lithium carbonate 300 MG capsule; TAKE 1 CAPSULE(300 MG) BY MOUTH AT BEDTIME     Please see After Visit Summary for patient specific instructions.  No future appointments.  No orders of the defined types were placed in this encounter.   -------------------------------

## 2023-09-29 ENCOUNTER — Encounter: Payer: Self-pay | Admitting: Psychiatry

## 2023-10-07 DIAGNOSIS — J029 Acute pharyngitis, unspecified: Secondary | ICD-10-CM | POA: Diagnosis not present

## 2023-10-19 DIAGNOSIS — R051 Acute cough: Secondary | ICD-10-CM | POA: Diagnosis not present

## 2023-10-19 DIAGNOSIS — J029 Acute pharyngitis, unspecified: Secondary | ICD-10-CM | POA: Diagnosis not present

## 2023-10-23 DIAGNOSIS — H6991 Unspecified Eustachian tube disorder, right ear: Secondary | ICD-10-CM | POA: Diagnosis not present

## 2023-10-23 DIAGNOSIS — J019 Acute sinusitis, unspecified: Secondary | ICD-10-CM | POA: Diagnosis not present

## 2023-10-23 DIAGNOSIS — H1031 Unspecified acute conjunctivitis, right eye: Secondary | ICD-10-CM | POA: Diagnosis not present

## 2024-01-08 DIAGNOSIS — M79672 Pain in left foot: Secondary | ICD-10-CM | POA: Diagnosis not present

## 2024-01-08 DIAGNOSIS — S92352A Displaced fracture of fifth metatarsal bone, left foot, initial encounter for closed fracture: Secondary | ICD-10-CM | POA: Diagnosis not present

## 2024-01-08 DIAGNOSIS — X501XXA Overexertion from prolonged static or awkward postures, initial encounter: Secondary | ICD-10-CM | POA: Diagnosis not present

## 2024-01-13 DIAGNOSIS — S92352A Displaced fracture of fifth metatarsal bone, left foot, initial encounter for closed fracture: Secondary | ICD-10-CM | POA: Diagnosis not present

## 2024-01-21 DIAGNOSIS — Z01419 Encounter for gynecological examination (general) (routine) without abnormal findings: Secondary | ICD-10-CM | POA: Diagnosis not present

## 2024-02-03 DIAGNOSIS — F330 Major depressive disorder, recurrent, mild: Secondary | ICD-10-CM | POA: Diagnosis not present

## 2024-02-03 DIAGNOSIS — F411 Generalized anxiety disorder: Secondary | ICD-10-CM | POA: Diagnosis not present

## 2024-02-03 DIAGNOSIS — F902 Attention-deficit hyperactivity disorder, combined type: Secondary | ICD-10-CM | POA: Diagnosis not present

## 2024-02-08 DIAGNOSIS — S92352D Displaced fracture of fifth metatarsal bone, left foot, subsequent encounter for fracture with routine healing: Secondary | ICD-10-CM | POA: Diagnosis not present

## 2024-03-07 DIAGNOSIS — S92352D Displaced fracture of fifth metatarsal bone, left foot, subsequent encounter for fracture with routine healing: Secondary | ICD-10-CM | POA: Diagnosis not present

## 2024-05-05 DIAGNOSIS — F330 Major depressive disorder, recurrent, mild: Secondary | ICD-10-CM | POA: Diagnosis not present

## 2024-05-05 DIAGNOSIS — F411 Generalized anxiety disorder: Secondary | ICD-10-CM | POA: Diagnosis not present

## 2024-05-05 DIAGNOSIS — F902 Attention-deficit hyperactivity disorder, combined type: Secondary | ICD-10-CM | POA: Diagnosis not present

## 2024-06-12 DIAGNOSIS — R42 Dizziness and giddiness: Secondary | ICD-10-CM | POA: Diagnosis not present

## 2024-06-12 DIAGNOSIS — Z13220 Encounter for screening for lipoid disorders: Secondary | ICD-10-CM | POA: Diagnosis not present

## 2024-06-12 DIAGNOSIS — Z8349 Family history of other endocrine, nutritional and metabolic diseases: Secondary | ICD-10-CM | POA: Diagnosis not present

## 2024-06-12 DIAGNOSIS — R030 Elevated blood-pressure reading, without diagnosis of hypertension: Secondary | ICD-10-CM | POA: Diagnosis not present

## 2024-06-12 DIAGNOSIS — Z0000 Encounter for general adult medical examination without abnormal findings: Secondary | ICD-10-CM | POA: Diagnosis not present
